# Patient Record
Sex: Female | Born: 1935 | Race: White | Hispanic: No | Marital: Married | Smoking: Former smoker
Health system: Southern US, Community
[De-identification: ages and names within clinical notes are randomized; demographics above are authoritative.]

## PROBLEM LIST (undated history)

## (undated) DIAGNOSIS — N183 Chronic kidney disease, stage 3 unspecified: Secondary | ICD-10-CM

## (undated) DIAGNOSIS — I609 Nontraumatic subarachnoid hemorrhage, unspecified: Secondary | ICD-10-CM

## (undated) HISTORY — DX: Chronic kidney disease, stage 3 unspecified: N18.30

## (undated) HISTORY — DX: Chronic kidney disease, stage 3 (moderate): N18.3

## (undated) HISTORY — DX: Nontraumatic subarachnoid hemorrhage, unspecified: I60.9

## (undated) HISTORY — PX: CHOLECYSTECTOMY: SHX55

---

## 2008-08-03 ENCOUNTER — Ambulatory Visit: Payer: Self-pay | Admitting: Family Medicine

## 2008-09-14 ENCOUNTER — Ambulatory Visit: Payer: Self-pay | Admitting: Family Medicine

## 2017-09-14 ENCOUNTER — Ambulatory Visit (INDEPENDENT_AMBULATORY_CARE_PROVIDER_SITE_OTHER): Payer: Medicare HMO | Admitting: Family Medicine

## 2017-09-14 ENCOUNTER — Encounter: Payer: Self-pay | Admitting: Family Medicine

## 2017-09-14 VITALS — BP 140/68 | HR 60 | Temp 98.6°F | Resp 12 | Ht 62.0 in | Wt 128.0 lb

## 2017-09-14 DIAGNOSIS — I10 Essential (primary) hypertension: Secondary | ICD-10-CM

## 2017-09-14 DIAGNOSIS — R413 Other amnesia: Secondary | ICD-10-CM

## 2017-09-14 DIAGNOSIS — Z23 Encounter for immunization: Secondary | ICD-10-CM

## 2017-09-14 DIAGNOSIS — E7800 Pure hypercholesterolemia, unspecified: Secondary | ICD-10-CM | POA: Diagnosis not present

## 2017-09-14 DIAGNOSIS — Z7689 Persons encountering health services in other specified circumstances: Secondary | ICD-10-CM | POA: Diagnosis not present

## 2017-09-14 NOTE — Progress Notes (Signed)
Subjective:    Patient ID: Susan Bradley, female    DOB: 1932/01/30, 83 y.o.   MRN: [id number]  HPI  She is a very pleasant 83 year old white female who is here today with her son-in-law to establish care.  Her son-in-law is a friend of mine and a Radio broadcast assistantlocal doctor..  [facility] [place] [facility] [place].  Her husband recently underwent back surgery and required a short-term stay in a rehab facility.  This apparently exacerbated underlying dementia that had previously been somewhat masked and the family decided that it was no longer prudent to have them live 2 hours away and they recommended that they move into a home closer to their daughter and son-in-law.  Patient is apparently suffering from sundowning.  Sundowning occurs typically around 2 or 3 in the afternoon and gradually worsens throughout the evening.  She demonstrates restless behavior and she paces the floor.  She quickly becomes agitated.  She has wild mood swings.  Her son-in-law had tried her on Seroquel.  Dose was started at 25 mg and up titrated to 150 mg.  Initially they saw benefit with sedation.  However after the patient adjusted to the medication, it seemed to actually exacerbate her sundowning and they have discontinued the Seroquel.  She is currently on maximum dose Zoloft 200 mg a day.  She has been on this medication for quite some time.  She is recently begun gabapentin off label to treat sundowning.  She started at 100 mg twice a day and has now up titrated to 200 mg twice a day.  Family is seeing some benefit from this with no apparent side effects at the present time.  Past medical history is unremarkable aside from 20 years ago when she suffered a subarachnoid hemorrhage after a fall and a head injury.  There was no apparent personality changes at that time.  Memory loss however became more obvious after the patient turned 67.  He has been gradually progressive and recently became  severe.  Today on our encounter however the patient is bright and alert.  She smiles and answers questions appropriately although some of her answers are incorrect.  She follows commands.  She is due today for a shot.  She has had Pneumovax 23 in the past Past Medical History:  Diagnosis Date  . SAH (subarachnoid hemorrhage) (HCC)    2000   Past Surgical History:  Procedure Laterality Date  . CHOLECYSTECTOMY     No current outpatient prescriptions on file prior to visit.   No current facility-administered medications on file prior to visit.    No Known Allergies Social History   Social History  . Marital status: Married    Spouse name: N/A  . Number of children: N/A  . Years of education: N/A   Occupational History  . Not on file.   Social History Main Topics  . Smoking status: Current Some Day Smoker  . Smokeless tobacco: Never Used  . Alcohol use No  . Drug use: No  . Sexual activity: Not on file   Other Topics Concern  . Not on file   Social History Narrative  . No narrative on file   No family history on file.   Review of Systems  All other systems reviewed and are negative.      Objective:   Physical Exam  Constitutional: She is oriented to person, place, and time. She appears well-developed and well-nourished. No distress.  HENT:  Right Ear: External ear normal.  Left Ear: External ear normal.  Nose: Nose normal.  Mouth/Throat: Oropharynx is clear and moist. No oropharyngeal exudate.  Eyes: Pupils are equal, round, and reactive to light. Conjunctivae and EOM are normal. Right eye exhibits no discharge. Left eye exhibits no discharge. No scleral icterus.  Neck: Normal range of motion. Neck supple. No JVD present. No thyromegaly present.  Cardiovascular: Normal rate and regular rhythm.   Murmur heard. Pulmonary/Chest: Effort normal and breath sounds normal. No respiratory distress. She has no wheezes. She has no rales.  Abdominal: Soft. Bowel sounds are  normal. She exhibits no distension and no mass. There is no tenderness. There is no rebound and no guarding.  Musculoskeletal: She exhibits no edema.  Lymphadenopathy:    She has no cervical adenopathy.  Neurological: She is alert and oriented to person, place, and time. She has normal reflexes. She displays normal reflexes. No cranial nerve deficit. She exhibits normal muscle tone. Coordination normal.  Skin: Skin is warm. No rash noted. She is not diaphoretic. No erythema. No pallor.  Psychiatric: She has a normal mood and affect. Her behavior is normal. Judgment and thought content normal.  Vitals reviewed.         Assessment & Plan:  Memory loss - Plan: CBC with Differential/Platelet, COMPLETE METABOLIC PANEL WITH GFR, TSH, Vitamin B12  Needs flu shot - Plan: Flu vaccine HIGH DOSE PF (Fluzone High dose)  Establishing care with new doctor, encounter for  Benign essential HTN  Pure hypercholesterolemia  At the present time we will continue the patient on Zoloft 200 mg a day.  Continue gabapentin 200 mg p.o. twice daily and reassess in 1 week.  They are using Xanax sparingly as needed for agitation.  Consider increasing gabapentin up to a maximum of 300 3 times daily.  I will check a CBC and a CMP today as well as a TSH and a vitamin B12 B12.  Blood pressure is well controlled, I see no reason to change her blood pressure medication at this time.  She will receive a flu shot.  If she continues to see mood swings, I would recommend discontinuing Zoloft and trying Trintellix.  He is failed Aricept in the past due to GI toxicity.  She is currently on Namenda.

## 2017-09-15 LAB — COMPLETE METABOLIC PANEL WITH GFR
AG RATIO: 1.7 (calc) (ref 1.0–2.5)
ALBUMIN MSPROF: 4.2 g/dL (ref 3.6–5.1)
ALKALINE PHOSPHATASE (APISO): 104 U/L (ref 33–130)
ALT: 14 U/L (ref 6–29)
AST: 19 U/L (ref 10–35)
BILIRUBIN TOTAL: 0.6 mg/dL (ref 0.2–1.2)
BUN / CREAT RATIO: 28 (calc) — AB (ref 6–22)
BUN: 39 mg/dL — ABNORMAL HIGH (ref 7–25)
CHLORIDE: 110 mmol/L (ref 98–110)
CO2: 25 mmol/L (ref 20–32)
Calcium: 9.4 mg/dL (ref 8.6–10.4)
Creat: 1.37 mg/dL — ABNORMAL HIGH (ref 0.60–0.88)
GFR, Est African American: 41 mL/min/{1.73_m2} — ABNORMAL LOW (ref >=60)
GFR, Est Non African American: 36 mL/min/{1.73_m2} — ABNORMAL LOW (ref >=60)
GLOBULIN: 2.5 g/dL (ref 1.9–3.7)
Glucose, Bld: 98 mg/dL (ref 65–99)
POTASSIUM: 5.1 mmol/L (ref 3.5–5.3)
SODIUM: 142 mmol/L (ref 135–146)
Total Protein: 6.7 g/dL (ref 6.1–8.1)

## 2017-09-15 LAB — CBC WITH DIFFERENTIAL/PLATELET
BASOS PCT: 0.3 %
Basophils Absolute: 23 cells/uL (ref 0–200)
EOS PCT: 2.7 %
Eosinophils Absolute: 203 cells/uL (ref 15–500)
HEMATOCRIT: 34.9 % — AB (ref 35.0–45.0)
HEMOGLOBIN: 11.9 g/dL (ref 11.7–15.5)
Lymphs Abs: 1590 cells/uL (ref 850–3900)
MCH: 31.5 pg (ref 27.0–33.0)
MCHC: 34.1 g/dL (ref 32.0–36.0)
MCV: 92.3 fL (ref 80.0–100.0)
MPV: 9.9 fL (ref 7.5–12.5)
Monocytes Relative: 6.4 %
Neutro Abs: 5205 cells/uL (ref 1500–7800)
Neutrophils Relative %: 69.4 %
Platelets: 257 10*3/uL (ref 140–400)
RBC: 3.78 10*6/uL — AB (ref 3.80–5.10)
RDW: 12.2 % (ref 11.0–15.0)
Total Lymphocyte: 21.2 %
WBC: 7.5 10*3/uL (ref 3.8–10.8)
WBCMIX: 480 {cells}/uL (ref 200–950)

## 2017-09-15 LAB — VITAMIN B12: VITAMIN B 12: 384 pg/mL (ref 200–1100)

## 2017-09-15 LAB — TSH: TSH: 2.24 m[IU]/L (ref 0.40–4.50)

## 2017-09-17 ENCOUNTER — Encounter: Payer: Self-pay | Admitting: Family Medicine

## 2017-09-17 DIAGNOSIS — N183 Chronic kidney disease, stage 3 unspecified: Secondary | ICD-10-CM | POA: Insufficient documentation

## 2017-10-01 ENCOUNTER — Telehealth: Payer: Self-pay | Admitting: Family Medicine

## 2017-10-01 NOTE — Telephone Encounter (Signed)
Pt is now taking gabapentin 2 pills 3 times daily and is almost out (only has enough to last this afternoon) wants us to call in script to [facility] [address] [place]. Also wants zoloft refilled and she is taking 2 pills once daily now, instead of how it was prescribed.

## 2017-10-02 MED ORDER — SERTRALINE HCL 100 MG PO TABS: 200.0000 mg | ORAL_TABLET | Freq: Every day | ORAL | 5 refills | Status: DC

## 2017-10-02 MED ORDER — GABAPENTIN 100 MG PO CAPS: 200.0000 mg | ORAL_CAPSULE | Freq: Three times a day (TID) | ORAL | 5 refills | Status: DC

## 2017-10-02 NOTE — Telephone Encounter (Signed)
Medication called/sent to requested pharmacy  

## 2017-10-06 ENCOUNTER — Emergency Department: Payer: Medicare HMO

## 2017-10-06 ENCOUNTER — Other Ambulatory Visit: Payer: Self-pay

## 2017-10-06 ENCOUNTER — Observation Stay
Admission: EM | Admit: 2017-10-06 | Discharge: 2017-10-08 | Disposition: A | Discharge status: Home / Self Care | Payer: Medicare HMO | Attending: Internal Medicine | Admitting: Internal Medicine

## 2017-10-06 DIAGNOSIS — R42 Dizziness and giddiness: Secondary | ICD-10-CM | POA: Diagnosis not present

## 2017-10-06 DIAGNOSIS — R4182 Altered mental status, unspecified: Secondary | ICD-10-CM

## 2017-10-06 DIAGNOSIS — F329 Major depressive disorder, single episode, unspecified: Secondary | ICD-10-CM | POA: Diagnosis not present

## 2017-10-06 DIAGNOSIS — I129 Hypertensive chronic kidney disease with stage 1 through stage 4 chronic kidney disease, or unspecified chronic kidney disease: Secondary | ICD-10-CM | POA: Diagnosis not present

## 2017-10-06 DIAGNOSIS — H409 Unspecified glaucoma: Secondary | ICD-10-CM | POA: Diagnosis not present

## 2017-10-06 DIAGNOSIS — R531 Weakness: Principal | ICD-10-CM

## 2017-10-06 DIAGNOSIS — R55 Syncope and collapse: Secondary | ICD-10-CM

## 2017-10-06 DIAGNOSIS — R001 Bradycardia, unspecified: Secondary | ICD-10-CM

## 2017-10-06 DIAGNOSIS — N183 Chronic kidney disease, stage 3 (moderate): Secondary | ICD-10-CM | POA: Diagnosis not present

## 2017-10-06 DIAGNOSIS — E860 Dehydration: Secondary | ICD-10-CM | POA: Insufficient documentation

## 2017-10-06 DIAGNOSIS — F17200 Nicotine dependence, unspecified, uncomplicated: Secondary | ICD-10-CM | POA: Insufficient documentation

## 2017-10-06 DIAGNOSIS — Z79899 Other long term (current) drug therapy: Secondary | ICD-10-CM | POA: Insufficient documentation

## 2017-10-06 DIAGNOSIS — F0390 Unspecified dementia without behavioral disturbance: Secondary | ICD-10-CM | POA: Insufficient documentation

## 2017-10-06 DIAGNOSIS — R262 Difficulty in walking, not elsewhere classified: Secondary | ICD-10-CM

## 2017-10-06 LAB — BASIC METABOLIC PANEL
Anion gap: 7 (ref 5–15)
BUN: 34 mg/dL — AB (ref 6–20)
CALCIUM: 8.8 mg/dL — AB (ref 8.9–10.3)
CHLORIDE: 109 mmol/L (ref 101–111)
CO2: 24 mmol/L (ref 22–32)
CREATININE: 1.25 mg/dL — AB (ref 0.44–1.00)
GFR calc non Af Amer: 39 mL/min — ABNORMAL LOW (ref >60)
GFR, EST AFRICAN AMERICAN: 45 mL/min — AB (ref >60)
GLUCOSE: 110 mg/dL — AB (ref 65–99)
Potassium: 4.2 mmol/L (ref 3.5–5.1)
Sodium: 140 mmol/L (ref 135–145)

## 2017-10-06 LAB — URINALYSIS, COMPLETE (UACMP) WITH MICROSCOPIC
BILIRUBIN URINE: NEGATIVE
Bacteria, UA: NONE SEEN
Glucose, UA: NEGATIVE mg/dL
Hgb urine dipstick: NEGATIVE
KETONES UR: NEGATIVE mg/dL
Leukocytes, UA: NEGATIVE
Nitrite: NEGATIVE
PH: 5 (ref 5.0–8.0)
Protein, ur: NEGATIVE mg/dL
SPECIFIC GRAVITY, URINE: 1.017 (ref 1.005–1.030)
SQUAMOUS EPITHELIAL / LPF: NONE SEEN

## 2017-10-06 LAB — CBC
HCT: 32.5 % — ABNORMAL LOW (ref 35.0–47.0)
Hemoglobin: 11 g/dL — ABNORMAL LOW (ref 12.0–16.0)
MCH: 31.6 pg (ref 26.0–34.0)
MCHC: 33.7 g/dL (ref 32.0–36.0)
MCV: 93.7 fL (ref 80.0–100.0)
PLATELETS: 233 10*3/uL (ref 150–440)
RBC: 3.47 MIL/uL — ABNORMAL LOW (ref 3.80–5.20)
RDW: 13.8 % (ref 11.5–14.5)
WBC: 6.4 10*3/uL (ref 3.6–11.0)

## 2017-10-06 LAB — TROPONIN I

## 2017-10-06 MED ORDER — ACETAMINOPHEN 325 MG PO TABS
650.0000 mg | ORAL_TABLET | Freq: Four times a day (QID) | ORAL | Status: DC | PRN
  Administered 2017-10-07: 21:00:00 650 mg via ORAL
  Filled 2017-10-06: qty 2

## 2017-10-06 MED ORDER — ONDANSETRON HCL 4 MG/2ML IJ SOLN: 4.0000 mg | Freq: Four times a day (QID) | INTRAMUSCULAR | Status: DC | PRN

## 2017-10-06 MED ORDER — HALOPERIDOL LACTATE 5 MG/ML IJ SOLN
1.0000 mg | Freq: Four times a day (QID) | INTRAMUSCULAR | Status: DC | PRN
  Administered 2017-10-06 – 2017-10-08 (×2): 1 mg via INTRAVENOUS
  Filled 2017-10-06 (×3): qty 1

## 2017-10-06 MED ORDER — ACETAMINOPHEN 650 MG RE SUPP: 650.0000 mg | Freq: Four times a day (QID) | RECTAL | Status: DC | PRN

## 2017-10-06 MED ORDER — CLOPIDOGREL BISULFATE 75 MG PO TABS
75.0000 mg | ORAL_TABLET | Freq: Every day | ORAL | Status: DC
  Administered 2017-10-07: 10:00:00 75 mg via ORAL
  Filled 2017-10-06: qty 1

## 2017-10-06 MED ORDER — ONDANSETRON HCL 4 MG PO TABS: 4.0000 mg | ORAL_TABLET | Freq: Four times a day (QID) | ORAL | Status: DC | PRN

## 2017-10-06 MED ORDER — QUETIAPINE FUMARATE 25 MG PO TABS
25.0000 mg | ORAL_TABLET | Freq: Every day | ORAL | Status: DC
  Administered 2017-10-06: 25 mg via ORAL
  Filled 2017-10-06: qty 1

## 2017-10-06 MED ORDER — HEPARIN SODIUM (PORCINE) 5000 UNIT/ML IJ SOLN
5000.0000 [IU] | Freq: Three times a day (TID) | INTRAMUSCULAR | Status: DC
  Administered 2017-10-07 – 2017-10-08 (×3): 5000 [IU] via SUBCUTANEOUS
  Filled 2017-10-06 (×3): qty 1

## 2017-10-06 MED ORDER — GABAPENTIN 100 MG PO CAPS
200.0000 mg | ORAL_CAPSULE | Freq: Three times a day (TID) | ORAL | Status: DC
  Administered 2017-10-07 – 2017-10-08 (×5): 200 mg via ORAL
  Filled 2017-10-06 (×5): qty 2

## 2017-10-06 MED ORDER — ALPRAZOLAM 0.5 MG PO TABS
0.5000 mg | ORAL_TABLET | Freq: Two times a day (BID) | ORAL | Status: DC | PRN
  Administered 2017-10-07: 10:00:00 0.5 mg via ORAL
  Filled 2017-10-06: qty 1

## 2017-10-06 MED ORDER — FAMOTIDINE 20 MG PO TABS
20.0000 mg | ORAL_TABLET | Freq: Every day | ORAL | Status: DC
  Administered 2017-10-07: 20 mg via ORAL
  Filled 2017-10-06: qty 1

## 2017-10-06 MED ORDER — LISINOPRIL 10 MG PO TABS
20.0000 mg | ORAL_TABLET | Freq: Every day | ORAL | Status: DC
  Administered 2017-10-07: 20 mg via ORAL
  Filled 2017-10-06: qty 2

## 2017-10-06 MED ORDER — TIMOLOL MALEATE 0.5 % OP SOLN
1.0000 [drp] | Freq: Two times a day (BID) | OPHTHALMIC | Status: DC
  Administered 2017-10-07 – 2017-10-08 (×3): 1 [drp] via OPHTHALMIC
  Filled 2017-10-06: qty 5

## 2017-10-06 MED ORDER — DOCUSATE SODIUM 100 MG PO CAPS
100.0000 mg | ORAL_CAPSULE | Freq: Two times a day (BID) | ORAL | Status: DC
  Administered 2017-10-07 – 2017-10-08 (×3): 100 mg via ORAL
  Filled 2017-10-06 (×3): qty 1

## 2017-10-06 MED ORDER — MEMANTINE HCL 5 MG PO TABS
10.0000 mg | ORAL_TABLET | Freq: Two times a day (BID) | ORAL | Status: DC
  Administered 2017-10-07 – 2017-10-08 (×3): 10 mg via ORAL
  Filled 2017-10-06 (×3): qty 2

## 2017-10-06 MED ORDER — PANTOPRAZOLE SODIUM 40 MG IV SOLR
40.0000 mg | Freq: Two times a day (BID) | INTRAVENOUS | Status: DC
  Administered 2017-10-07 (×2): 40 mg via INTRAVENOUS
  Filled 2017-10-06 (×3): qty 40

## 2017-10-06 MED ORDER — SODIUM CHLORIDE 0.9 % IV SOLN
INTRAVENOUS | Status: DC
  Administered 2017-10-06 – 2017-10-08 (×3): via INTRAVENOUS

## 2017-10-06 MED ORDER — SODIUM CHLORIDE 0.9 % IV BOLUS (SEPSIS)
1000.0000 mL | Freq: Once | INTRAVENOUS | Status: AC
  Administered 2017-10-06: 1000 mL via INTRAVENOUS

## 2017-10-06 MED ORDER — SERTRALINE HCL 50 MG PO TABS
150.0000 mg | ORAL_TABLET | Freq: Every day | ORAL | Status: DC
  Administered 2017-10-07 – 2017-10-08 (×2): 150 mg via ORAL
  Filled 2017-10-06 (×2): qty 3

## 2017-10-06 MED ORDER — DORZOLAMIDE HCL 2 % OP SOLN
1.0000 [drp] | Freq: Two times a day (BID) | OPHTHALMIC | Status: DC
  Administered 2017-10-07 – 2017-10-08 (×3): 1 [drp] via OPHTHALMIC
  Filled 2017-10-06: qty 10

## 2017-10-06 MED ORDER — BISACODYL 10 MG RE SUPP: 10.0000 mg | Freq: Every day | RECTAL | Status: DC | PRN

## 2017-10-06 NOTE — ED Notes (Signed)
Family at bedside, reports pt has dementia. Pt normally only oriented to person and place. What concerned family is that pt is more lethargic than normal and lost her balance earlier.

## 2017-10-06 NOTE — ED Provider Notes (Addendum)
[facility] Emergency Department Provider Note  Time seen: 2:20 PM  I have reviewed the triage vital signs and the nursing notes.   HISTORY  Chief Complaint Altered Mental Status    HPI Susan Bradley is a 83 y.o. female with a past medical history of CKD, SAH, dementia, presents to the emergency department with increased somnolence/altered mental status.  According to the daughter who is a caregiver along with her siblings the patient was acting fairly normal first thing this morning at breakfast.  However by around 9 or 10:00 she states the patient was acting very somnolent and tired.  Patient attempted to stand up and seemed off balance and had to sit back down.  Daughter states she had to physically assist the patient to have her stand up and help her walk which she states is abnormal.  She denies any focal weakness that she could identify.  Denies slurred speech denies vomiting, diarrhea, cough or congestion.  Denies any known fever. Past Medical History:  Diagnosis Date  . CKD (chronic kidney disease) stage 3, GFR 30-59 ml/min (HCC)   . SAH (subarachnoid hemorrhage) (HCC)    2000    Patient Active Problem List   Diagnosis Date Noted  . CKD (chronic kidney disease) stage 3, GFR 30-59 ml/min (HCC)     Past Surgical History:  Procedure Laterality Date  . CHOLECYSTECTOMY      Prior to Admission medications   Medication Sig Start Date End Date Taking? Authorizing Provider  atenolol (TENORMIN) 100 MG tablet Take 100 mg by mouth daily.     [provider]  atorvastatin (LIPITOR) 20 MG tablet Take 20 mg by mouth daily.     [provider]  gabapentin (NEURONTIN) 100 MG capsule Take 2 capsules (200 mg total) 3 (three) times daily by mouth. 10/02/17   Donita BrooksPickard, Warren T, MD  hydrochlorothiazide (MICROZIDE) 12.5 MG capsule Take 12.5 mg by mouth daily.  07/26/17 07/26/18  [provider]  lisinopril (PRINIVIL,ZESTRIL) 40 MG tablet Take 40 mg  by mouth daily.     [provider]  memantine (NAMENDA) 10 MG tablet Take 10 mg by mouth 2 (two) times daily.  01/23/17 01/23/18  [provider]  sertraline (ZOLOFT) 100 MG tablet Take 2 tablets (200 mg total) daily by mouth. 10/02/17   Donita BrooksPickard, Warren T, MD    No Known Allergies  No family history on file.  Social History Social History   Tobacco Use  . Smoking status: Current Some Day Smoker  . Smokeless tobacco: Never Used  Substance Use Topics  . Alcohol use: No  . Drug use: No    Review of Systems Unable to obtain an adequate shows accurate review of systems due to baseline dementia, possible altered mental status.  ____________________________________________   PHYSICAL EXAM:  VITAL SIGNS: ED Triage Vitals  Enc Vitals Group     BP 10/06/17 1309 (!) 97/55     Pulse Rate 10/06/17 1309 (!) 48     Resp 10/06/17 1309 20     Temp 10/06/17 1312 97.8 F (36.6 C)     Temp Source 10/06/17 1312 Oral     SpO2 10/06/17 1309 97 %     Weight 10/06/17 1308 138 lb 4.8 oz (62.7 kg)     Height --      Head Circumference --      Peak Flow --      Pain Score --      Pain Loc --  Pain Edu? --      Excl. in GC? --    Constitutional: Patient is somewhat somnolent, keeps her eyes closed through most of the exam.  She will open her eyes to verbal stimuli, answer questions but then closes her eyes once again.  Oriented to person and place only, but significant dementia per daughter. Eyes: Normal exam ENT   Head: Normocephalic and atraumatic   Mouth/Throat: Mucous membranes are moist. Cardiovascular: Normal rate, regular rhythm. Respiratory: Normal respiratory effort without tachypnea nor retractions. Breath sounds are clear  Gastrointestinal: Soft and nontender. No distention.   Musculoskeletal: Nontender with normal range of motion in all extremities.  Neurologic:  Normal speech and language. No gross focal neurologic deficits.  Equal grip strength.   Cranial nerves appear intact. Skin:  Skin is warm, dry and intact.  Psychiatric: Mood and affect are normal.   ____________________________________________    EKG  EKG reviewed and interpreted by myself shows sinus bradycardia at 48 bpm, narrow QRS, normal axis, largely normal intervals with no concerning ST changes.  ____________________________________________    RADIOLOGY  Chest x-ray is negative for acute disease.  ____________________________________________   INITIAL IMPRESSION / ASSESSMENT AND PLAN / ED COURSE  Pertinent labs & imaging results that were available during my care of the patient were reviewed by me and considered in my medical decision making (see chart for details).  Patient presents to the emergency department with increased somnolence and weakness compared to baseline per daughter.  Differential is quite broad but would include infectious etiology, dehydration, renal insufficiency, CVA/bleed, pneumonia, urinary tract infection, electrolyte abnormality.  We will check labs, chest x-ray, CT scan of the head, urinalysis and continue to closely monitor in the emergency department.  Patient and daughter agreeable to this plan of care.  Patient's labs are largely unchanged from baseline.  No significant abnormalities.  Urinalysis negative.  CT scan pending.  CT scan is negative.  Patient receiving IV fluids in the emergency department.  Blood pressure 103/46.  No old records available for comparison.  Family is asking about home health or any further in-home care.  I have placed a social work consult and discussed the patient with social work will be down to see the patient after she finishes on the floor.  Family says the patient is acting much more normal but the daughter says still is somewhat more somnolent than normal but much improved from earlier today.  We are awaiting the arrival of the other daughter (the primary caregiver) for further history however I  anticipate likely discharge home.  Patient CARE signed out to oncoming physician.  Primary caregiver/daughter is now here he states the patient cannot be discharged home.  States she normally is the caregiver for her husband however today she has been too weak to stand on her own.  Patient is a full assist to stand due to generalized weakness in the emergency department.  As the primary caregiver at her house does not believe she is safe for discharge home as the patient cannot ambulate or stand on her own and has no home health or assistance and as this appears to be an acute change from her baseline we will admit to the hospital for further workup.  I will order an MRI.  I discussed the patient with the hospitalist for admission.  ____________________________________________   FINAL CLINICAL IMPRESSION(S) / ED DIAGNOSES  Altered mental status    Minna AntisPaduchowski, Loletta Harper, MD 10/06/17 1547    Minna AntisPaduchowski, Sheddrick Lattanzio, MD  10/06/17 1614  

## 2017-10-06 NOTE — H&P (Signed)
History and Physical    Susan CivatteJoyce Bradley ZOX:[id number] DOB: Aug 13, 1932 DOA: 10/06/2017  Referring physician: Dr. Lenard LancePaduchowski PCP: Donita BrooksPickard, Warren T, MD  Specialists: none  Chief Complaint: weakness  HPI: Susan CivatteJoyce Stauder is a 83 y.o. female has a past medical history significant for [facility], CKD, and dementia now with worsening confusion and weakness with inability to ambulate. In ER, pt noted to be mildly dehydrated and bradycardia. Remainder of ER w/u negative including head CT and UA. Family is unable to care for pt at home and she is now admitted. No fever. No N/V/D. Denies CP or SOB. Family states she "nearly passed out" but no actual syncope  Review of Systems: The patient denies anorexia, fever, weight loss,, vision loss, decreased hearing, hoarseness, chest pain, syncope, dyspnea on exertion, peripheral edema, balance deficits, hemoptysis, abdominal pain, melena, hematochezia, severe indigestion/heartburn, hematuria, incontinence, genital sores, muscle weakness, suspicious skin lesions, transient blindness,  depression, unusual weight change, abnormal bleeding, enlarged lymph nodes, angioedema, and breast masses.   Past Medical History:  Diagnosis Date  . CKD (chronic kidney disease) stage 3, GFR 30-59 ml/min (HCC)   . SAH (subarachnoid hemorrhage) (HCC)    2000   Past Surgical History:  Procedure Laterality Date  . CHOLECYSTECTOMY     Social History:  reports that she has been smoking.  she has never used smokeless tobacco. She reports that she does not drink alcohol or use drugs.  No Known Allergies  History reviewed. No pertinent family history.  Prior to Admission medications   Medication Sig Start Date End Date Taking? Authorizing Provider  ALPRAZolam Prudy Feeler(XANAX) 0.5 MG tablet Take 0.5 mg by mouth 2 (two) times daily as needed for sleep or anxiety. 10/03/17  Yes [provider]  atenolol (TENORMIN) 25 MG tablet Take 25 mg by mouth daily.    Yes [provider]   dorzolamide (TRUSOPT) 2 % ophthalmic solution Place 1 drop into both eyes 2 (two) times daily.   Yes [provider]  gabapentin (NEURONTIN) 100 MG capsule Take 2 capsules (200 mg total) 3 (three) times daily by mouth. 10/02/17  Yes Pickard, Priscille HeidelbergWarren T, MD  hydrochlorothiazide (MICROZIDE) 12.5 MG capsule Take 12.5 mg by mouth daily.  07/26/17 07/26/18 Yes [provider]  lisinopril (PRINIVIL,ZESTRIL) 20 MG tablet Take 20 mg by mouth daily.    Yes [provider]  memantine (NAMENDA) 10 MG tablet Take 10 mg by mouth 2 (two) times daily.  01/23/17 01/23/18 Yes [provider]  potassium chloride (K-DUR,KLOR-CON) 10 MEQ tablet Take 10 mEq by mouth daily.   Yes [provider]  ranitidine (ZANTAC) 300 MG tablet Take 300 mg by mouth daily.   Yes [provider]  sertraline (ZOLOFT) 100 MG tablet Take 2 tablets (200 mg total) daily by mouth. 10/02/17  Yes Pickard, Priscille HeidelbergWarren T, MD  timolol (BETIMOL) 0.5 % ophthalmic solution Place 1 drop into both eyes 2 (two) times daily.   Yes [provider]   Physical Exam: Vitals:   10/06/17 1330 10/06/17 1342 10/06/17 1430 10/06/17 1500  BP: (!) 89/55 (!) 103/46 (!) 110/53 (!) 124/58  Pulse:  (!) 49 (!) 47 (!) 49  Resp: 17 14 15 14   Temp:      TempSrc:      SpO2:  97% 97% 99%  Weight:         General:  No apparent distress, WDWN, [place]/AT  Eyes: PERRL, EOMI, no scleral icterus, conjunctiva clear  ENT: moist oropharynx without exudate, TM's  benign, dentition fair  Neck: supple, no lymphadenopathy. No bruits or thyromegaly  Cardiovascular: slow rate with regular rhythm with a 2/6 systolic murmur noted. No rubs or gallops; 2+ peripheral pulses, no JVD, no peripheral edema  Respiratory: CTA biL, good air movement without wheezing, rhonchi or crackled. Respiratory effort normal  Abdomen: soft, non tender to palpation, positive bowel sounds, no guarding, no rebound  Skin: no rashes or  lesions  Musculoskeletal: normal bulk and tone, no joint swelling  Psychiatric: normal mood and affect, A&OX2(not to time)  Neurologic: CN 2-12 grossly intact, Motor strength 5/5 in all 4 groups with symmetric DTR's and non-focal sensory exam  Labs on Admission:  Basic Metabolic Panel: Recent Labs  Lab 10/06/17 1307  NA 140  K 4.2  CL 109  CO2 24  GLUCOSE 110*  BUN 34*  CREATININE 1.25*  CALCIUM 8.8*   Liver Function Tests: No results for input(s): AST, ALT, ALKPHOS, BILITOT, PROT, ALBUMIN in the last 168 hours. No results for input(s): LIPASE, AMYLASE in the last 168 hours. No results for input(s): AMMONIA in the last 168 hours. CBC: Recent Labs  Lab 10/06/17 1307  WBC 6.4  HGB 11.0*  HCT 32.5*  MCV 93.7  PLT 233   Cardiac Enzymes: Recent Labs  Lab 10/06/17 1307  TROPONINI <0.03    BNP (last 3 results) No results for input(s): BNP in the last 8760 hours.  ProBNP (last 3 results) No results for input(s): PROBNP in the last 8760 hours.  CBG: No results for input(s): GLUCAP in the last 168 hours.  Radiological Exams on Admission: Dg Chest 2 View  Result Date: 10/06/2017 CLINICAL DATA:  Altered mental status, lethargy, and loss of balance today. EXAM: CHEST  2 VIEW COMPARISON:  None. FINDINGS: Cardiomediastinal silhouette is within normal limits for AP technique. Aortic atherosclerosis is noted. The lungs are hyperinflated with slight chronic appearing interstitial coarsening and minimal scattered lung scarring. No confluent airspace opacity, edema, pleural effusion, or pneumothorax is identified. No acute osseous abnormality is identified. IMPRESSION: Hyperinflation/COPD without evidence acute cardiopulmonary disease. Electronically Signed   By: Sebastian Ache M.D.   On: 10/06/2017 14:14   Ct Head Wo Contrast  Result Date: 10/06/2017 CLINICAL DATA:  Altered mental status. EXAM: CT HEAD WITHOUT CONTRAST TECHNIQUE: Contiguous axial images were obtained from the  base of the skull through the vertex without intravenous contrast. COMPARISON:  Brain MRI 08/03/2008 FINDINGS: Brain: There is mild right greater than left lateral ventriculomegaly which is increased from 2009. Ventricular enlargement is out of proportion to the degree of sulcal enlargement. Patchy subcortical and periventricular white matter hypodensities are greater than the extent of FLAIR/ T2 abnormality on the prior MRI and are moderately advanced for age. There is no evidence of acute cortically based infarct, intracranial hemorrhage, mass, midline shift, or extra-axial fluid collection. Vascular: Calcified atherosclerosis at the skullbase. Skull: No fracture focal osseous lesion. Sinuses/Orbits: Visualized paranasal sinuses and mastoid air cells are clear. Bilateral cataract extraction. Other: None. IMPRESSION: 1. No evidence of acute infarct or hemorrhage. 2. Progressive cerebral white matter disease likely reflecting chronic small vessel ischemia. 3. Progressive mild lateral ventriculomegaly, favored to reflect central predominant cerebral atrophy or less likely hydrocephalus. Electronically Signed   By: Sebastian Ache M.D.   On: 10/06/2017 14:21    EKG: Independently reviewed.  Assessment/Plan Principal Problem:   Generalized weakness Active Problems:   Near syncope   Bradycardia   Ambulatory dysfunction   Will observe on floor with telemetry and  begin IV fluids. Hold Atenolol. MRI of brain ordered. Consult Neurology, PT, and CSW. Echo ordered due to murmur and bradycardia. Will consult Cardiology as well. Repeat labs in AM.  Diet: soft Fluids: NS@75  DVT Prophylaxis: SQ Heparin  Code Status: FULl  Family Communication: yes  Disposition Plan: TBD  Time spent: 50 min

## 2017-10-06 NOTE — [place] FL2 (Deleted)
[facility] [place] MEDICAID FL2 LEVEL OF CARE SCREENING TOOL     IDENTIFICATION  Patient Name: Susan Bradley Birthdate: 1932-09-22 Sex: female Admission Date (Current Location): 10/06/2017  [place]   Facility and Address:  [facility], [address], [place], [place] [address]      Provider Number: [id number]  Attending Physician Name and Address:  Marguarite ArbourSparks, Jeffrey D, MD  Relative Name and Phone Number:       Current Level of Care: Hospital Recommended Level of Care: Assisted Living Facility Prior Approval Number:    Date Approved/Denied:   PASRR Number:    Discharge Plan: Domiciliary (Rest home)    Current Diagnoses: Patient Active Problem List   Diagnosis Date Noted  . Near syncope 10/06/2017  . Generalized weakness 10/06/2017  . Bradycardia 10/06/2017  . Ambulatory dysfunction 10/06/2017  . CKD (chronic kidney disease) stage 3, GFR 30-59 ml/min (HCC)     Orientation RESPIRATION BLADDER Height & Weight     Self  Normal Continent Weight: 138 lb 4.8 oz (62.7 kg) Height:     BEHAVIORAL SYMPTOMS/MOOD NEUROLOGICAL BOWEL NUTRITION STATUS      Continent Diet(normal)  AMBULATORY STATUS COMMUNICATION OF NEEDS Skin   Independent Verbally Normal                       Personal Care Assistance Level of Assistance  Bathing, Feeding, Dressing, Total care Bathing Assistance: Independent Feeding assistance: Independent Dressing Assistance: Limited assistance Total Care Assistance: Limited assistance   Functional Limitations Info  Sight, Hearing, Speech Sight Info: Adequate Hearing Info: Adequate Speech Info: Adequate    SPECIAL CARE FACTORS FREQUENCY  PT (By licensed PT), OT (By licensed OT)                    Contractures Contractures Info: Not present    Additional Factors Info  Code Status, Allergies Code Status Info: full code Allergies Info: none           Current Medications (10/06/2017):   This is the current hospital active medication list Current Facility-Administered Medications  Medication Dose Route Frequency Provider Last Rate Last Dose  . 0.9 %  sodium chloride infusion   Intravenous Continuous Marguarite ArbourSparks, Jeffrey D, MD      . acetaminophen (TYLENOL) tablet 650 mg  650 mg Oral Q6H PRN Marguarite ArbourSparks, Jeffrey D, MD       Or  . acetaminophen (TYLENOL) suppository 650 mg  650 mg Rectal Q6H PRN Marguarite ArbourSparks, Jeffrey D, MD      . ALPRAZolam Prudy Feeler(XANAX) tablet 0.5 mg  0.5 mg Oral BID PRN Marguarite ArbourSparks, Jeffrey D, MD      . bisacodyl (DULCOLAX) suppository 10 mg  10 mg Rectal Daily PRN Marguarite ArbourSparks, Jeffrey D, MD      . clopidogrel (PLAVIX) tablet 75 mg  75 mg Oral Daily Marguarite ArbourSparks, Jeffrey D, MD      . docusate sodium (COLACE) capsule 100 mg  100 mg Oral BID Marguarite ArbourSparks, Jeffrey D, MD      . dorzolamide (TRUSOPT) 2 % ophthalmic solution 1 drop  1 drop Both Eyes BID Marguarite ArbourSparks, Jeffrey D, MD      . famotidine (PEPCID) tablet 20 mg  20 mg Oral QHS Marguarite ArbourSparks, Jeffrey D, MD      . gabapentin (NEURONTIN) capsule 200 mg  200 mg Oral TID Marguarite ArbourSparks, Jeffrey D, MD      . heparin injection 5,000 Units  5,000 Units Subcutaneous Q8H Sparks,  Duane LopeJeffrey D, MD      . lisinopril (PRINIVIL,ZESTRIL) tablet 20 mg  20 mg Oral Daily Marguarite ArbourSparks, Jeffrey D, MD      . memantine [facility](NAMENDA) tablet 10 mg  10 mg Oral BID Marguarite ArbourSparks, Jeffrey D, MD      . ondansetron [facility](ZOFRAN) tablet 4 mg  4 mg Oral Q6H PRN Marguarite ArbourSparks, Jeffrey D, MD       Or  . ondansetron (ZOFRAN) injection 4 mg  4 mg Intravenous Q6H PRN Marguarite ArbourSparks, Jeffrey D, MD      . pantoprazole (PROTONIX) injection 40 mg  40 mg Intravenous Q12H Marguarite ArbourSparks, Jeffrey D, MD      . sertraline (ZOLOFT) tablet 150 mg  150 mg Oral Daily Marguarite ArbourSparks, Jeffrey D, MD      . timolol (BETIMOL) 0.5 % ophthalmic solution 1 drop  1 drop Both Eyes BID Marguarite ArbourSparks, Jeffrey D, MD       Current Outpatient Medications  Medication Sig Dispense Refill  . ALPRAZolam (XANAX) 0.5 MG tablet Take 0.5 mg by mouth 2 (two) times daily as needed for sleep or anxiety.     Marland Kitchen. atenolol (TENORMIN) 25 MG tablet Take 25 mg by mouth daily.     . dorzolamide (TRUSOPT) 2 % ophthalmic solution Place 1 drop into both eyes 2 (two) times daily.    Marland Kitchen. gabapentin (NEURONTIN) 100 MG capsule Take 2 capsules (200 mg total) 3 (three) times daily by mouth. 180 capsule 5  . hydrochlorothiazide (MICROZIDE) 12.5 MG capsule Take 12.5 mg by mouth daily.     Marland Kitchen. lisinopril (PRINIVIL,ZESTRIL) 20 MG tablet Take 20 mg by mouth daily.     . memantine (NAMENDA) 10 MG tablet Take 10 mg by mouth 2 (two) times daily.     . potassium chloride (K-DUR,KLOR-CON) 10 MEQ tablet Take 10 mEq by mouth daily.    . ranitidine (ZANTAC) 300 MG tablet Take 300 mg by mouth daily.    . sertraline (ZOLOFT) 100 MG tablet Take 2 tablets (200 mg total) daily by mouth. 60 tablet 5  . timolol (BETIMOL) 0.5 % ophthalmic solution Place 1 drop into both eyes 2 (two) times daily.       Discharge Medications: Please see discharge summary for a list of discharge medications.  Relevant Imaging Results:  Relevant Lab Results:   Additional Information [id number]  Arrie SenateBandi, Hanad Leino HostetterM, [place]

## 2017-10-06 NOTE — Clinical Social Work Note (Signed)
Clinical Social Work Assessment  Patient Details  Name: Susan Bradley MRN: [id number] Date of Birth: Dec 28, 1931  Date of referral:  10/06/17               Reason for consult:  Facility Placement, Discharge Planning                Permission sought to share information with:  Family Supports Permission granted to share information::  Yes, Verbal Permission Granted  Name::     Susan Bradley ( daughter) Susan Bradley [phone] Susan Bradley son  Agency::  All facilities  Relationship::     Contact Information:     Housing/Transportation Living arrangements for the past 2 months:  Apartment Source of Information:  Power of [place], Adult Children Patient Interpreter Needed:  None Criminal Activity/Legal Involvement Pertinent to Current Situation/Hospitalization:  No - Comment as needed Significant Relationships:  Adult Children, Church, [place] [employer], Other Family Members, Spouse Lives with:  Adult Children, Spouse Do you feel safe going back to the place where you live?  No(Daughter is very concerned with altered mental status) Need for family participation in patient care:  Yes (Comment)  Care giving concerns: Daughter Susan Bradley very concerned and unable to care for both her aged parents and with her mother having altered mental status family feels overwhelmed to to have patient return to her house.   Social Worker assessment / plan: LCSW introduced myself to patient 83 year old female and sought verbal permission to speak in front of her family members. Patient agreed, daughters are health care power of attorneys and explained patient has dementia. Patient is very independent with her ADLs and does not use a walker. She has a living spouse of [phone] years who resides with patient in small house, daughter lives with them as well. Family would like information sent via the HUB for ALF options. Medicaid application is in progress/[employer] Medicare. Family understands patient will be in  observation-and have the financial means to have her placed in a memory care setting ( I month) until medicaid approved. Patient is oriented x1. LCSW completed assessment and fl2 started and sent out information via the HUB and will present bed offers.  Employment status:   Retired  Health and safety inspectornsurance information: [employer]/Medicare- Medicaid Pending   PT Recommendations:   TBD Information / Referral to community resources: Dementia Services    Patient/Family's Response to care: They are insistent something's is very wrong as the patient has substantial altered mental status from last night and would like answers and are concerned medically  Patient/Family's Understanding of and Emotional Response to Diagnosis, Current Treatment, and Prognosis:  Family is not sure what the best plan will be for the patient but have agreed for LCSW to send information out via the HUB for memory care -ALF.  Emotional Assessment Appearance:  Appears stated age Attitude/Demeanor/Rapport:  Unable to Assess Affect (typically observed):  Irritable, Restless Orientation:  Oriented to Self Alcohol / Substance use:  Not Applicable Psych involvement (Current and /or in the community):  No (Comment)  Discharge Needs  Concerns to be addressed:  Cognitive Concerns, Home Safety Concerns Readmission within the last 30 days:    Current discharge risk:  None Barriers to Discharge:  Continued Medical Work up   KurtenBandi, Ivylandlaudine M, LCSW 10/06/2017, 5:10 PM

## 2017-10-06 NOTE — ED Triage Notes (Signed)
Pt came to ED via EMS from family's house. Pt was fine this morning per EMS, altered mental status starting 1 hour ago. Pt alert to name and place. Hr 48.

## 2017-10-07 ENCOUNTER — Observation Stay: Payer: Medicare HMO

## 2017-10-07 DIAGNOSIS — R531 Weakness: Secondary | ICD-10-CM | POA: Diagnosis not present

## 2017-10-07 LAB — COMPREHENSIVE METABOLIC PANEL
ALBUMIN: 3.2 g/dL — AB (ref 3.5–5.0)
ALT: 14 U/L (ref 14–54)
ANION GAP: 6 (ref 5–15)
AST: 20 U/L (ref 15–41)
Alkaline Phosphatase: 103 U/L (ref 38–126)
BUN: 25 mg/dL — AB (ref 6–20)
CALCIUM: 8.4 mg/dL — AB (ref 8.9–10.3)
CO2: 24 mmol/L (ref 22–32)
Chloride: 110 mmol/L (ref 101–111)
Creatinine, Ser: 1.04 mg/dL — ABNORMAL HIGH (ref 0.44–1.00)
GFR calc Af Amer: 56 mL/min — ABNORMAL LOW (ref >60)
GFR calc non Af Amer: 48 mL/min — ABNORMAL LOW (ref >60)
GLUCOSE: 117 mg/dL — AB (ref 65–99)
Potassium: 3.9 mmol/L (ref 3.5–5.1)
Sodium: 140 mmol/L (ref 135–145)
TOTAL PROTEIN: 5.9 g/dL — AB (ref 6.5–8.1)
Total Bilirubin: 0.6 mg/dL (ref 0.3–1.2)

## 2017-10-07 LAB — CBC
HCT: 33.4 % — ABNORMAL LOW (ref 35.0–47.0)
HEMOGLOBIN: 11.6 g/dL — AB (ref 12.0–16.0)
MCH: 32.4 pg (ref 26.0–34.0)
MCHC: 34.8 g/dL (ref 32.0–36.0)
MCV: 92.9 fL (ref 80.0–100.0)
Platelets: 232 10*3/uL (ref 150–440)
RBC: 3.59 MIL/uL — ABNORMAL LOW (ref 3.80–5.20)
RDW: 13.8 % (ref 11.5–14.5)
WBC: 5.8 10*3/uL (ref 3.6–11.0)

## 2017-10-07 MED ORDER — ALPRAZOLAM 0.5 MG PO TABS
0.2500 mg | ORAL_TABLET | Freq: Two times a day (BID) | ORAL | Status: DC | PRN
  Administered 2017-10-07 – 2017-10-08 (×2): 0.25 mg via ORAL
  Filled 2017-10-07 (×2): qty 1

## 2017-10-07 NOTE — Progress Notes (Signed)
Patient returned to the flor from MRI at this time

## 2017-10-07 NOTE — Progress Notes (Signed)
Patient has progressively gotten more confused and impulsive this evening. Have been trying to get in contact with patient's family to see if they can sit with patient tonight for safety before placing telesitter/sitter at bedside. Tried patient's daughter, Jamesetta Sohyllis, who is local/primary care giver x2 but didn't answer & mailbox is full. Patient's other daughter, Steward DroneBrenda, lives out of town & unable to come.   Patient is close to nurses station, low bed, call bell within reach, safety mats on floor, frequent re-orienting. PRN Xanax given at 1749 r/t patient being very anxious. Will continue to closely monitor.

## 2017-10-07 NOTE — Consult Note (Signed)
Reason for Consult:confusion  Referring Physician: Dr. Juliene PinaMody   CC: confusion   HPI: Susan Bradley is an 83 y.o. female with past medical history significant for [facility], CKD, and dementia now with worsening confusion and weakness with inability to ambulate.  As per family pt has been progressive dementia for the past 4-5 yrs and has worsened recently.  Pt came in with increased confusion and disorientation.  CTH no acute abnormalities.    Past Medical History:  Diagnosis Date  . CKD (chronic kidney disease) stage 3, GFR 30-59 ml/min (HCC)   . SAH (subarachnoid hemorrhage) (HCC)    2000    Past Surgical History:  Procedure Laterality Date  . CHOLECYSTECTOMY      History reviewed. No pertinent family history.  Social History:  reports that she quit smoking about 2 months ago. she has never used smokeless tobacco. She reports that she does not drink alcohol or use drugs.  Allergies  Allergen Reactions  . Seroquel [Quetiapine] Other (See Comments)    Makes pt's agitation worse.    Medications: I have reviewed the patient's current medications.  ROS: Unable to obtain due to confusion   Physical Examination: Blood pressure (!) 152/98, pulse (!) 52, temperature (!) 97.4 F (36.3 C), temperature source Oral, resp. rate 18, height 5\' 4"  (1.626 m), weight 59.9 kg (132 lb), SpO2 100 %.  Neurological Examination   Mental Status: Alert to name only  Cranial Nerves: II: Discs flat bilaterally; Visual fields grossly normal, pupils equal, round, reactive to light and accommodation III,IV, VI: ptosis not present, extra-ocular motions intact bilaterally V,VII: smile symmetric, facial light touch sensation normal bilaterally VIII: hearing normal bilaterally IX,X: gag reflex present XI: bilateral shoulder shrug XII: midline tongue extension Motor: Right : Upper extremity   4/5    Left:     Upper extremity   4/5  Lower extremity   4/5     Lower extremity   4/5 Tone and bulk:normal tone  throughout; no atrophy noted Sensory: Pinprick and light touch intact throughout, bilaterally Deep Tendon Reflexes: 1+ and symmetric throughout Plantars: Right: downgoing   Left: downgoing Cerebellar: normal finger-to-nose Gait: not tested       Laboratory Studies:   Basic Metabolic Panel: Recent Labs  Lab 10/06/17 1307  NA 140  K 4.2  CL 109  CO2 24  GLUCOSE 110*  BUN 34*  CREATININE 1.25*  CALCIUM 8.8*    Liver Function Tests: No results for input(s): AST, ALT, ALKPHOS, BILITOT, PROT, ALBUMIN in the last 168 hours. No results for input(s): LIPASE, AMYLASE in the last 168 hours. No results for input(s): AMMONIA in the last 168 hours.  CBC: Recent Labs  Lab 10/06/17 1307  WBC 6.4  HGB 11.0*  HCT 32.5*  MCV 93.7  PLT 233    Cardiac Enzymes: Recent Labs  Lab 10/06/17 1307  TROPONINI <0.03    BNP: Invalid input(s): POCBNP  CBG: No results for input(s): GLUCAP in the last 168 hours.  Microbiology: No results found for this or any previous visit.  Coagulation Studies: No results for input(s): LABPROT, INR in the last 72 hours.  Urinalysis:  Recent Labs  Lab 10/06/17 1311  COLORURINE YELLOW*  LABSPEC 1.017  PHURINE 5.0  GLUCOSEU NEGATIVE  HGBUR NEGATIVE  BILIRUBINUR NEGATIVE  KETONESUR NEGATIVE  PROTEINUR NEGATIVE  NITRITE NEGATIVE  LEUKOCYTESUR NEGATIVE    Lipid Panel:  No results found for: CHOL, TRIG, HDL, CHOLHDL, VLDL, LDLCALC  HgbA1C: No results found for: HGBA1C  Urine Drug Screen:  No results found for: LABOPIA, COCAINSCRNUR, LABBENZ, AMPHETMU, THCU, LABBARB  Alcohol Level: No results for input(s): ETH in the last 168 hours.  Other results: EKG: normal EKG, normal sinus rhythm, unchanged from previous tracings.  Imaging: Dg Chest 2 View  Result Date: 10/06/2017 CLINICAL DATA:  Altered mental status, lethargy, and loss of balance today. EXAM: CHEST  2 VIEW COMPARISON:  None. FINDINGS: Cardiomediastinal silhouette is within  normal limits for AP technique. Aortic atherosclerosis is noted. The lungs are hyperinflated with slight chronic appearing interstitial coarsening and minimal scattered lung scarring. No confluent airspace opacity, edema, pleural effusion, or pneumothorax is identified. No acute osseous abnormality is identified. IMPRESSION: Hyperinflation/COPD without evidence acute cardiopulmonary disease. Electronically Signed   By: Sebastian Ache M.D.   On: 10/06/2017 14:14   Ct Head Wo Contrast  Result Date: 10/06/2017 CLINICAL DATA:  Altered mental status. EXAM: CT HEAD WITHOUT CONTRAST TECHNIQUE: Contiguous axial images were obtained from the base of the skull through the vertex without intravenous contrast. COMPARISON:  Brain MRI 08/03/2008 FINDINGS: Brain: There is mild right greater than left lateral ventriculomegaly which is increased from 2009. Ventricular enlargement is out of proportion to the degree of sulcal enlargement. Patchy subcortical and periventricular white matter hypodensities are greater than the extent of FLAIR/ T2 abnormality on the prior MRI and are moderately advanced for age. There is no evidence of acute cortically based infarct, intracranial hemorrhage, mass, midline shift, or extra-axial fluid collection. Vascular: Calcified atherosclerosis at the skullbase. Skull: No fracture focal osseous lesion. Sinuses/Orbits: Visualized paranasal sinuses and mastoid air cells are clear. Bilateral cataract extraction. Other: None. IMPRESSION: 1. No evidence of acute infarct or hemorrhage. 2. Progressive cerebral white matter disease likely reflecting chronic small vessel ischemia. 3. Progressive mild lateral ventriculomegaly, favored to reflect central predominant cerebral atrophy or less likely hydrocephalus. Electronically Signed   By: Sebastian Ache M.D.   On: 10/06/2017 14:21   Mr Brain Wo Contrast  Result Date: 10/07/2017 CLINICAL DATA:  Increased somnolence/altered mental status. Generalized  weakness. EXAM: MRI HEAD WITHOUT CONTRAST TECHNIQUE: Multiplanar, multiecho pulse sequences of the brain and surrounding structures were obtained without intravenous contrast. COMPARISON:  10/06/2017 head CT and 08/03/2008 MRI FINDINGS: Brain: There is no evidence of acute infarct, mass, midline shift, or extra-axial fluid collection. A few scattered chronic microhemorrhages are noted in the cerebral hemispheres. There is mild asymmetric right lateral ventriculomegaly with most notable dilatation of the atrium and temporal horn and new from the prior MRI. This is favored to be reflective of progressive cerebral atrophy including asymmetric right parietal white matter volume loss rather than hydrocephalus. Patchy subcortical and deep cerebral white matter T2 hyperintensities have progressed from the prior MRI and extend in a confluent manner to the subcortical white matter of the right parietal lobe, nonspecific but compatible with chronic small vessel ischemic disease. No definite cortical encephalomalacia is identified. Cerebral atrophy is overall moderately advanced for age. There is a developmental venous anomaly in the right cerebellum. Vascular: Major intracranial vascular flow voids are preserved. Skull and upper cervical spine: No suspicious marrow lesion. Sinuses/Orbits: Bilateral cataract extraction. No significant sinus disease. Other: None. IMPRESSION: 1. No acute intracranial abnormality. 2. Moderate chronic small vessel ischemic disease and cerebral atrophy, substantially from 2009. Electronically Signed   By: Sebastian Ache M.D.   On: 10/07/2017 12:06     Assessment/Plan:  83 y.o. female with past medical history significant for SAH, CKD, and dementia now with worsening confusion and  weakness with inability to ambulate.  As per family pt has been progressive dementia for the past 4-5 yrs and has worsened recently.  Pt came in with increased confusion and disorientation.  CTH no acute abnormalities.    - MRI brain done no acute abnormalities - This is likely progresive dementia associated with encephalopathy secondary to possible dehydration - Hydration  - pt/ot - follow up with neurology as out pt.     10/07/2017, 1:22 PM

## 2017-10-07 NOTE — Consult Note (Signed)
[facility]  CARDIOLOGY CONSULT NOTE  Patient ID: Susan Bradley MRN: [id number] DOB/AGE: 03/01/1932 83 y.o.  Admit date: 10/06/2017 Referring Physician Dr. Judithann SheenSParks Primary Physician   Primary Cardiologist   Reason for Consultation bradycardia  HPI: Patient is an 83 year old female with history of hypertension, chronic kidney disease and dementia who presented to the emergency room with increasing confusion and weakness and inability to ambulate.  She was noted to be dehydrated and relatively bradycardic.  Head CT was unremarkable.  Patient is on able to care for herself at home.  Laboratories in the emergency room revealed chronic renal insufficiency with serum creatinine of 1.25.  Hemoglobin and hematocrit were 11.0 and 32.5.  Serum troponin was normal.  EKG showed sinus bradycardia at a rate of 50 bpm.  She was treated with atenolol at 100 mg daily, atorvastatin 20 mg daily as well as hydrochlorothiazide quinapril as an outpatient.  She was relatively hypotensive on presentation with a blood pressure of 97/50.  She denies chest pain.  Review of Systems  Constitutional: Positive for malaise/fatigue.  Eyes: Negative.   Cardiovascular: Negative.   Genitourinary: Negative.   Skin: Negative.   Neurological: Positive for dizziness and weakness.  Endo/Heme/Allergies: Negative.   Psychiatric/Behavioral: Positive for memory loss.    Past Medical History:  Diagnosis Date  . CKD (chronic kidney disease) stage 3, GFR 30-59 ml/min (HCC)   . SAH (subarachnoid hemorrhage) (HCC)    2000    History reviewed. No pertinent family history.  Social History   Socioeconomic History  . Marital status: Married    Spouse name: Not on file  . Number of children: Not on file  . Years of education: Not on file  . Highest education level: Not on file  Social Needs  . Financial resource strain: Not very hard  . Food insecurity - worry: Never true  . Food  insecurity - inability: Never true  . Transportation needs - medical: No  . Transportation needs - non-medical: Not on file  Occupational History  . Not on file  Tobacco Use  . Smoking status: Former Smoker    Last attempt to quit: 08/06/2017    Years since quitting: 0.1  . Smokeless tobacco: Never Used  Substance and Sexual Activity  . Alcohol use: No  . Drug use: No  . Sexual activity: No  Other Topics Concern  . Not on file  Social History Narrative  . Not on file    Past Surgical History:  Procedure Laterality Date  . CHOLECYSTECTOMY       Medications Prior to Admission  Medication Sig Dispense Refill Last Dose  . ALPRAZolam (XANAX) 0.5 MG tablet Take 0.5 mg by mouth 2 (two) times daily as needed for sleep or anxiety.   PRN at PRN  . atenolol (TENORMIN) 25 MG tablet Take 25 mg by mouth daily.    10/06/2017 at 0930  . dorzolamide (TRUSOPT) 2 % ophthalmic solution Place 1 drop into both eyes 2 (two) times daily.   10/06/2017 at 0930  . gabapentin (NEURONTIN) 100 MG capsule Take 2 capsules (200 mg total) 3 (three) times daily by mouth. 180 capsule 5 10/06/2017 at 0930  . hydrochlorothiazide (MICROZIDE) 12.5 MG capsule Take 12.5 mg by mouth daily.    10/06/2017 at 0930  . lisinopril (PRINIVIL,ZESTRIL) 20 MG tablet Take 20 mg by mouth daily.    10/06/2017 at 0930  . memantine (NAMENDA) 10 MG tablet Take 10 mg  by mouth 2 (two) times daily.    10/06/2017 at 0930  . potassium chloride (K-DUR,KLOR-CON) 10 MEQ tablet Take 10 mEq by mouth daily.   10/06/2017 at 0930  . ranitidine (ZANTAC) 300 MG tablet Take 300 mg by mouth daily.   10/06/2017 at 0930  . sertraline (ZOLOFT) 100 MG tablet Take 2 tablets (200 mg total) daily by mouth. 60 tablet 5 10/06/2017 at 0930  . timolol (BETIMOL) 0.5 % ophthalmic solution Place 1 drop into both eyes 2 (two) times daily.   10/06/2017 at 0930    Physical Exam: Blood pressure (!) 145/57, pulse (!) 49, temperature 97.9 F (36.6 C), temperature source  Oral, resp. rate 18, height 5\' 4"  (1.626 m), weight 59.9 kg (132 lb), SpO2 100 %.   Wt Readings from Last 1 Encounters:  10/07/17 59.9 kg (132 lb)     General appearance: cooperative and slowed mentation Head: Normocephalic, without obvious abnormality, atraumatic Resp: clear to auscultation bilaterally Cardio: Bradycardia GI: soft, non-tender; bowel sounds normal; no masses,  no organomegaly Extremities: extremities normal, atraumatic, no cyanosis or edema Neurologic: Grossly normal  Labs:   Lab Results  Component Value Date   WBC 6.4 10/06/2017   HGB 11.0 (L) 10/06/2017   HCT 32.5 (L) 10/06/2017   MCV 93.7 10/06/2017   PLT 233 10/06/2017    Recent Labs  Lab 10/06/17 1307  NA 140  K 4.2  CL 109  CO2 24  BUN 34*  CREATININE 1.25*  CALCIUM 8.8*  GLUCOSE 110*   Lab Results  Component Value Date   TROPONINI <0.03 10/06/2017      Radiology: No acute cardiopulmonary disease. EKG: Sinus bradycardia  ASSESSMENT AND PLAN: 83 year old female with history of hypertension, chronic kidney disease and mild to moderate dementia who was admitted admitted with worsening confusion felt to have relative dehydration.  She is on Tenormin at 100 mg daily as an outpatient.  Patient denies any complaints at present but is a somewhat difficult historian.  Her daughter is giving some of the history.  It is unclear whether the patient had syncope.  She is hemodynamically stable at present with heart rates in the upper 50s.  Will discontinue Tenormin for now and follow heart rate response as this worsens out.  Echo is pending.  Likely will be able to improve after stopping Tenormin.  Further recommendations based the results of her response to this. Signed: Dalia HeadingKenneth A Jeniel Slauson MD, [facility] 10/07/2017, 9:28 AM

## 2017-10-07 NOTE — Progress Notes (Signed)
[facility] [place] [facility]   PATIENT NAME: Susan CivatteJoyce Deason    MR#:  [id number]  DATE OF BIRTH:  [birth year]  SUBJECTIVE:   Family at bedside daughter upset that patient received Seroquel yesterday without asking her. She reports that patient does not do well and Seroquel. She would like to have patient have Xanax before MRI is planned today.  REVIEW OF SYSTEMS:    Review of Systems  Constitutional: Negative for fever, chills weight loss Positive generalized weakness HENT: Negative for ear pain, nosebleeds, congestion, facial swelling, rhinorrhea, neck pain, neck stiffness and ear discharge.   Respiratory: Negative for cough, shortness of breath, wheezing  Cardiovascular: Negative for chest pain, palpitations and leg swelling.  Gastrointestinal: Negative for heartburn, abdominal pain, vomiting, diarrhea or consitpation Genitourinary: Negative for dysuria, urgency, frequency, hematuria Musculoskeletal: Negative for back pain or joint pain Neurological: Negative for dizziness, seizures, syncope, focal weakness,  numbness and headaches.  Hematological: Does not bruise/bleed easily.  Psychiatric/Behavioral: Positive for dementia     Tolerating Diet: yes      DRUG ALLERGIES:   Allergies  Allergen Reactions  . Seroquel [Quetiapine] Other (See Comments)    Makes pt's agitation worse.    VITALS:  Blood pressure (!) 145/57, pulse (!) 49, temperature 97.9 F (36.6 C), temperature source Oral, resp. rate 18, height 5\' 4"  (1.626 m), weight 59.9 kg (132 lb), SpO2 100 %.  PHYSICAL EXAMINATION:  Constitutional: Appears well-developed and well-nourished. No distress. HENT: Normocephalic. Marland Kitchen. Oropharynx is clear and moist.  Eyes: Conjunctivae and EOM are normal. PERRLA, no scleral icterus.  Neck: Normal ROM. Neck supple. No JVD. No tracheal deviation. CVS: Sinus bradycardia, S1/S2 +, no murmurs, no gallops, no carotid bruit.  Pulmonary: Effort and breath sounds  normal, no stridor, rhonchi, wheezes, rales.  Abdominal: Soft. BS +,  no distension, tenderness, rebound or guarding.  Musculoskeletal: Normal range of motion. No edema and no tenderness.  Neuro: Alert. CN 2-12 grossly intact. No focal deficits. Skin: Skin is warm and dry. No rash noted. Psychiatric: Normal mood and affect.      LABORATORY PANEL:   CBC Recent Labs  Lab 10/06/17 1307  WBC 6.4  HGB 11.0*  HCT 32.5*  PLT 233   ------------------------------------------------------------------------------------------------------------------  Chemistries  Recent Labs  Lab 10/06/17 1307  NA 140  K 4.2  CL 109  CO2 24  GLUCOSE 110*  BUN 34*  CREATININE 1.25*  CALCIUM 8.8*   ------------------------------------------------------------------------------------------------------------------  Cardiac Enzymes Recent Labs  Lab 10/06/17 1307  TROPONINI <0.03   ------------------------------------------------------------------------------------------------------------------  RADIOLOGY:  Dg Chest 2 View  Result Date: 10/06/2017 CLINICAL DATA:  Altered mental status, lethargy, and loss of balance today. EXAM: CHEST  2 VIEW COMPARISON:  None. FINDINGS: Cardiomediastinal silhouette is within normal limits for AP technique. Aortic atherosclerosis is noted. The lungs are hyperinflated with slight chronic appearing interstitial coarsening and minimal scattered lung scarring. No confluent airspace opacity, edema, pleural effusion, or pneumothorax is identified. No acute osseous abnormality is identified. IMPRESSION: Hyperinflation/COPD without evidence acute cardiopulmonary disease. Electronically Signed   By: Sebastian AcheAllen  Grady M.D.   On: 10/06/2017 14:14   Ct Head Wo Contrast  Result Date: 10/06/2017 CLINICAL DATA:  Altered mental status. EXAM: CT HEAD WITHOUT CONTRAST TECHNIQUE: Contiguous axial images were obtained from the base of the skull through the vertex without intravenous  contrast. COMPARISON:  Brain MRI 08/03/2008 FINDINGS: Brain: There is mild right greater than left lateral ventriculomegaly which is increased from 2009. Ventricular enlargement is  out of proportion to the degree of sulcal enlargement. Patchy subcortical and periventricular white matter hypodensities are greater than the extent of FLAIR/ T2 abnormality on the prior MRI and are moderately advanced for age. There is no evidence of acute cortically based infarct, intracranial hemorrhage, mass, midline shift, or extra-axial fluid collection. Vascular: Calcified atherosclerosis at the skullbase. Skull: No fracture focal osseous lesion. Sinuses/Orbits: Visualized paranasal sinuses and mastoid air cells are clear. Bilateral cataract extraction. Other: None. IMPRESSION: 1. No evidence of acute infarct or hemorrhage. 2. Progressive cerebral white matter disease likely reflecting chronic small vessel ischemia. 3. Progressive mild lateral ventriculomegaly, favored to reflect central predominant cerebral atrophy or less likely hydrocephalus. Electronically Signed   By: Sebastian AcheAllen  Grady M.D.   On: 10/06/2017 14:21     ASSESSMENT AND PLAN:   83 year old female with a history of chronic kidney disease stage III, mild to moderate dementia and essential hypertension who presents with generalized weakness and confusion.  1. Generalized weakness: Patient will undergo MRI to evaluate for CVA. Family would like to have Xanax prior to MRI. Physical therapy consultation requested Patient was noted have sinus bradycardia and therefore beta blocker has been discontinued which could be contributing to generalized weakness. No signs of infection. Head CT was negative for acute pathology. Await neurology consultation.  2. Sinus bradycardia: Continue to hold atenolol. Case discussed with cardiology.  3. Dementia: Continue Namenda 4. Depression: Continue Zoloft 5. Essential hypertension: Due to bradycardia atenolol has been  discontinued HCTZ discontinued for now due to concerns of dehydration. Continue lisinopril 6. Glaucoma: Continue eyedrops  Management plans discussed with the patient and daughter and she is in agreement.  CODE STATUS: FULL  TOTAL TIME TAKING CARE OF THIS PATIENT: 30 minutes.     POSSIBLE D/C tomorrow, DEPENDING ON CLINICAL CONDITION.   Marquite Attwood M.D on 10/07/2017 at 10:30 AM  Between 7am to 6pm - Pager - [phone] After 6pm go to [email or web address] - password [email or web address]  [facility]  Office  [phone]  CC: Primary care physician; Donita BrooksPickard, Warren T, MD  Note: This dictation was prepared with Dragon dictation along with smaller phrase technology. Any transcriptional errors that result from this process are unintentional.

## 2017-10-07 NOTE — Progress Notes (Signed)
Assumed patient care at 1500. Patient is pleasantly confused. No family currently at bedside. Room near nurses station, bed alarm on, call bell within reach.   CSW working on placement. PT eval completed. Cardiology & Neurology following. HR sinus rhythm in 60s- Atenolol stopped today. Will monitor closely.

## 2017-10-07 NOTE — Progress Notes (Signed)
Late submission due to time constraints  LCSW unable to obtain passr # and web site for NCMUST was not up.  Delta Air LinesClaudine Xenia Nile LCSW [phone]

## 2017-10-07 NOTE — Progress Notes (Signed)
LCSW was able to obtain a PASSR number and spoke to patients daughter. She has requested information sent to [facility]: LCSW called [facility] and they do not have a bed available today. They will review Fl2 and assessment and will follow up with medical floor SW.  Weekday medical SW will present BED offers tomorrow to family   Arrie SenateClaudine Tregan Read LCSW [phone]

## 2017-10-07 NOTE — Evaluation (Signed)
Physical Therapy Evaluation Patient Details Name: Susan Bradley MRN: [id number] DOB: 08-24-1932 Today's Date: 10/07/2017   History of Present Illness  Patient is a pleasant 83 y/o female that presents after family noted she was difficult to arouse and severely weak yesterday. Workup thus far has largely been negative.   Clinical Impression  Patient is a fairly independent 83 y/o female that presents after episode of lethargy and weakness yesterday. CT and MRI of the brain have both been negative for acute processes. Today she is able to transfer in and out of bed independently, no assistance required to complete sit to stand transfers. She does require use of bilateral UEs on IV pole to maintain balance during ambulation, she is noted to have short shuffling steps while ambulating. She otherwise appears to be at her baseline of mobility, would likely benefit from HHPT to work on higher level balance and use of RW at home.     Follow Up Recommendations Home health PT    Equipment Recommendations  Rolling walker with 5" wheels    Recommendations for Other Services       Precautions / Restrictions Precautions Precautions: Fall Restrictions Weight Bearing Restrictions: No      Mobility  Bed Mobility Overal bed mobility: Independent             General bed mobility comments: No deficits identified in bed mobility.   Transfers Overall transfer level: Independent               General transfer comment: No deficits identified in sit to stand transfers.   Ambulation/Gait Ambulation/Gait assistance: Modified independent (Device/Increase time) Ambulation Distance (Feet): 200 Feet Assistive device: (IV pole with bilateral UEs) Gait Pattern/deviations: Shuffle   Gait velocity interpretation: <1.8 ft/sec, indicative of risk for recurrent falls General Gait Details: Patient noted to have short shuffling gait pattern, discussed with daughter and this appears to be her baseline,  she does not appear to favor one LE vs ther other.   Stairs            Wheelchair Mobility    Modified Rankin (Stroke Patients Only)       Balance Overall balance assessment: Modified Independent                                           Pertinent Vitals/Pain Pain Assessment: No/denies pain    Home Living Family/patient expects to be discharged to:: Private residence Living Arrangements: Spouse/significant other;Children Available Help at Discharge: Family;Available PRN/intermittently Type of Home: House       Home Layout: One level Home Equipment: Walker - 2 wheels      Prior Function Level of Independence: Independent         Comments: Prior to this admission family reports patient has been independent with mobility, her cognition is more of her limiting factor.      Hand Dominance        Extremity/Trunk Assessment   Upper Extremity Assessment Upper Extremity Assessment: Overall WFL for tasks assessed    Lower Extremity Assessment Lower Extremity Assessment: Overall WFL for tasks assessed       Communication   Communication: No difficulties  Cognition Arousal/Alertness: Awake/alert Behavior During Therapy: WFL for tasks assessed/performed Overall Cognitive Status: History of cognitive impairments - at baseline  General Comments: Patient is able to respond to questions, she does not appear to provide much insight into her current situation.       General Comments      Exercises     Assessment/Plan    PT Assessment Patient needs continued PT services  PT Problem List Decreased strength;Decreased safety awareness;Decreased mobility;Decreased activity tolerance;Decreased cognition;Decreased balance;Decreased knowledge of use of DME       PT Treatment Interventions Therapeutic activities;DME instruction;Gait training;Therapeutic exercise;Stair training;Balance  training;Patient/family education;Neuromuscular re-education    PT Goals (Current goals can be found in the Care Plan section)  Acute Rehab PT Goals Patient Stated Goal: To find out why she had her episode yesterday  PT Goal Formulation: With family Time For Goal Achievement: 10/21/17 Potential to Achieve Goals: Good    Frequency Min 2X/week   Barriers to discharge        Co-evaluation               AM-PAC PT "6 Clicks" Daily Activity  Outcome Measure Difficulty turning over in bed (including adjusting bedclothes, sheets and blankets)?: None Difficulty moving from lying on back to sitting on the side of the bed? : None Difficulty sitting down on and standing up from a chair with arms (e.g., wheelchair, bedside commode, etc,.)?: None Help needed moving to and from a bed to chair (including a wheelchair)?: None Help needed walking in hospital room?: A Little Help needed climbing 3-5 steps with a railing? : A Little 6 Click Score: 22    End of Session Equipment Utilized During Treatment: Gait belt Activity Tolerance: Patient tolerated treatment well Patient left: in bed;with bed alarm set;with call bell/phone within reach;with family/visitor present Nurse Communication: Mobility status PT Visit Diagnosis: Unsteadiness on feet (R26.81);Muscle weakness (generalized) (M62.81)    Time: [id number] PT Time Calculation (min) (ACUTE ONLY): 16 min   Charges:   PT Evaluation $PT Eval Low Complexity: 1 Low     PT G Codes:   PT G-Codes **NOT FOR INPATIENT CLASS** Functional Assessment Tool Used: AM-PAC 6 Clicks Basic Mobility Functional Limitation: Mobility: Walking and moving around Mobility: Walking and Moving Around Current Status (V4098(G8978): At least 1 percent but less than 20 percent impaired, limited or restricted Mobility: Walking and Moving Around Goal Status [phone](G8979): At least 1 percent but less than 20 percent impaired, limited or restricted    Alva GarnetPatrick McNamara PT, DPT,  CSCS    10/07/2017, 2:08 PM

## 2017-10-07 NOTE — Progress Notes (Signed)
LCSW attempted to obtain and apply for PASSR number and website is not operational at this time.   Delta Air LinesClaudine Katrese Shell LCSW [phone]

## 2017-10-07 NOTE — Progress Notes (Signed)
Spoke with patient's daughter, Jamesetta Sohyllis. She does not want a telesitter placed yet- will call back to give decision if someone can come sit with patient or if we can implement a safety sitter (person or video monitoring).

## 2017-10-07 NOTE — Progress Notes (Signed)
Patient off the floor to MRI, patient was pre medicated with xanax 0.5mg  as per order , family at bedside .

## 2017-10-07 NOTE — Progress Notes (Signed)
Claudine CSW spoke with daughter about update on placement search.

## 2017-10-07 NOTE — Progress Notes (Signed)
Patient's daughter, Jamesetta Sohyllis, called back. Someone from their family will be coming to stay with patient tonight to ensure patient safety.

## 2017-10-07 NOTE — Care Management Obs Status (Signed)
MEDICARE OBSERVATION STATUS NOTIFICATION   Patient Details  Name: Susan Bradley MRN: [id number] Date of Birth: 08-15-1932   Medicare Observation Status Notification Given:  Yes    Abagail Limb A, RN 10/07/2017, 1:52 PM

## 2017-10-07 NOTE — NC FL2 (Signed)
[place] MEDICAID FL2 LEVEL OF CARE SCREENING TOOL     IDENTIFICATION  Patient Name: Susan CivatteJoyce Bradley Birthdate: Jun 17, 1932 Sex: female Admission Date (Current Location): 10/06/2017  [place]   Facility and Address:  [facility], [address], [place], [place] [address]      Provider Number: [id number]  Attending Physician Name and Address:  Adrian SaranMody, Sital, MD  Relative Name and Phone Number:       Current Level of Care: Hospital Recommended Level of Care: Assisted Living Facility Prior Approval Number:    Date Approved/Denied:   PASRR Number: [id number]  Discharge Plan: Domiciliary (Rest home)    Current Diagnoses: Patient Active Problem List   Diagnosis Date Noted  . Near syncope 10/06/2017  . Generalized weakness 10/06/2017  . Bradycardia 10/06/2017  . Ambulatory dysfunction 10/06/2017  . CKD (chronic kidney disease) stage 3, GFR 30-59 ml/min (HCC)     Orientation RESPIRATION BLADDER Height & Weight     Self  Normal Continent Weight: 132 lb (59.9 kg) Height:  5\' 4"  (162.6 cm)  BEHAVIORAL SYMPTOMS/MOOD NEUROLOGICAL BOWEL NUTRITION STATUS      Continent Diet(normal)  AMBULATORY STATUS COMMUNICATION OF NEEDS Skin   Independent Verbally Normal                       Personal Care Assistance Level of Assistance  Bathing, Feeding, Dressing, Total care Bathing Assistance: Independent Feeding assistance: Independent Dressing Assistance: Limited assistance Total Care Assistance: Limited assistance   Functional Limitations Info  Sight, Hearing, Speech Sight Info: Adequate Hearing Info: Adequate Speech Info: Adequate    SPECIAL CARE FACTORS FREQUENCY  PT (By licensed PT), OT (By licensed OT)                    Contractures Contractures Info: Not present    Additional Factors Info  Code Status, Allergies Code Status Info: full code Allergies Info: none           Current Medications  (10/07/2017):  This is the current hospital active medication list Current Facility-Administered Medications  Medication Dose Route Frequency Provider Last Rate Last Dose  . 0.9 %  sodium chloride infusion   Intravenous Continuous Marguarite ArbourSparks, Jeffrey D, MD 75 mL/hr at 10/07/17 0902    . acetaminophen (TYLENOL) tablet 650 mg  650 mg Oral Q6H PRN Marguarite ArbourSparks, Jeffrey D, MD       Or  . acetaminophen (TYLENOL) suppository 650 mg  650 mg Rectal Q6H PRN Marguarite ArbourSparks, Jeffrey D, MD      . ALPRAZolam Prudy Feeler(XANAX) tablet 0.25 mg  0.25 mg Oral BID PRN Adrian SaranMody, Sital, MD      . bisacodyl (DULCOLAX) suppository 10 mg  10 mg Rectal Daily PRN Marguarite ArbourSparks, Jeffrey D, MD      . clopidogrel (PLAVIX) tablet 75 mg  75 mg Oral Daily Marguarite ArbourSparks, Jeffrey D, MD   75 mg at 10/07/17 1007  . docusate sodium (COLACE) capsule 100 mg  100 mg Oral BID Marguarite ArbourSparks, Jeffrey D, MD   100 mg at 10/07/17 1007  . dorzolamide (TRUSOPT) 2 % ophthalmic solution 1 drop  1 drop Both Eyes BID Marguarite ArbourSparks, Jeffrey D, MD   1 drop at 10/07/17 1026  . famotidine (PEPCID) tablet 20 mg  20 mg Oral QHS Marguarite ArbourSparks, Jeffrey D, MD      . gabapentin (NEURONTIN) capsule 200 mg  200 mg Oral TID Marguarite ArbourSparks, Jeffrey D, MD   200 mg at 10/07/17 1600  .  haloperidol lactate (HALDOL) injection 1 mg  1 mg Intravenous Q6H PRN Alford HighlandWieting, Richard, MD   1 mg at 10/06/17 2031  . heparin injection 5,000 Units  5,000 Units Subcutaneous Q8H Marguarite ArbourSparks, Jeffrey D, MD   5,000 Units at 10/07/17 1326  . lisinopril (PRINIVIL,ZESTRIL) tablet 20 mg  20 mg Oral Daily Marguarite ArbourSparks, Jeffrey D, MD   20 mg at 10/07/17 1008  . memantine (NAMENDA) tablet 10 mg  10 mg Oral BID Marguarite ArbourSparks, Jeffrey D, MD   10 mg at 10/07/17 1007  . ondansetron (ZOFRAN) tablet 4 mg  4 mg Oral Q6H PRN Marguarite ArbourSparks, Jeffrey D, MD       Or  . ondansetron (ZOFRAN) injection 4 mg  4 mg Intravenous Q6H PRN Marguarite ArbourSparks, Jeffrey D, MD      . pantoprazole (PROTONIX) injection 40 mg  40 mg Intravenous Q12H Marguarite ArbourSparks, Jeffrey D, MD   40 mg at 10/07/17 1009  . sertraline (ZOLOFT) tablet  150 mg  150 mg Oral Daily Marguarite ArbourSparks, Jeffrey D, MD   150 mg at 10/07/17 1008  . timolol (TIMOPTIC) 0.5 % ophthalmic solution 1 drop  1 drop Both Eyes BID Marguarite ArbourSparks, Jeffrey D, MD   1 drop at 10/07/17 1009     Discharge Medications: Please see discharge summary for a list of discharge medications.  Relevant Imaging Results:  Relevant Lab Results:   Additional Information SSN [id number]  Susan SenateBandi, Susan Kellenberger Barker [place], [place]

## 2017-10-08 ENCOUNTER — Observation Stay
Admit: 2017-10-08 | Discharge: 2017-10-08 | Disposition: A | Discharge status: Home / Self Care | Payer: Medicare HMO | Attending: Internal Medicine | Admitting: Internal Medicine

## 2017-10-08 LAB — BASIC METABOLIC PANEL
ANION GAP: 4 — AB (ref 5–15)
BUN: 24 mg/dL — ABNORMAL HIGH (ref 6–20)
CALCIUM: 8.6 mg/dL — AB (ref 8.9–10.3)
CHLORIDE: 110 mmol/L (ref 101–111)
CO2: 25 mmol/L (ref 22–32)
Creatinine, Ser: 0.99 mg/dL (ref 0.44–1.00)
GFR calc non Af Amer: 51 mL/min — ABNORMAL LOW (ref >60)
GFR, EST AFRICAN AMERICAN: 59 mL/min — AB (ref >60)
Glucose, Bld: 86 mg/dL (ref 65–99)
POTASSIUM: 3.6 mmol/L (ref 3.5–5.1)
Sodium: 139 mmol/L (ref 135–145)

## 2017-10-08 LAB — ECHOCARDIOGRAM COMPLETE
Height: 64 in
Weight: 2112 oz

## 2017-10-08 MED ORDER — PANTOPRAZOLE SODIUM 40 MG PO TBEC
40.0000 mg | DELAYED_RELEASE_TABLET | Freq: Two times a day (BID) | ORAL | Status: DC
  Administered 2017-10-08 (×2): 40 mg via ORAL
  Filled 2017-10-08 (×2): qty 1

## 2017-10-08 MED ORDER — LISINOPRIL 40 MG PO TABS: 40.0000 mg | ORAL_TABLET | Freq: Every day | ORAL | 0 refills | Status: DC

## 2017-10-08 MED ORDER — HYDRALAZINE HCL 20 MG/ML IJ SOLN
10.0000 mg | INTRAMUSCULAR | Status: DC | PRN
  Administered 2017-10-08: 10 mg via INTRAVENOUS
  Filled 2017-10-08: qty 1

## 2017-10-08 MED ORDER — LISINOPRIL 20 MG PO TABS: 40.0000 mg | ORAL_TABLET | Freq: Every day | ORAL | Status: DC

## 2017-10-08 MED ORDER — AMLODIPINE BESYLATE 5 MG PO TABS
5.0000 mg | ORAL_TABLET | Freq: Every day | ORAL | Status: DC
  Administered 2017-10-08: 5 mg via ORAL
  Filled 2017-10-08: qty 1

## 2017-10-08 NOTE — Discharge Summary (Signed)
[facility] [place] [facility]   PATIENT NAME: Susan Bradley    MR#:  [id number]  DATE OF BIRTH:  1932-06-10  DATE OF ADMISSION:  10/06/2017 ADMITTING PHYSICIAN: Marguarite Arbour, MD  DATE OF DISCHARGE: 10/08/2017  PRIMARY CARE PHYSICIAN: Donita Brooks, MD    ADMISSION DIAGNOSIS:  Weakness [R53.1] Altered mental status, unspecified altered mental status type [R41.82]  DISCHARGE DIAGNOSIS:  Principal Problem:   Generalized weakness Active Problems:   Near syncope   Bradycardia   Ambulatory dysfunction   SECONDARY DIAGNOSIS:   Past Medical History:  Diagnosis Date  . CKD (chronic kidney disease) stage 3, GFR 30-59 ml/min (HCC)   . SAH (subarachnoid hemorrhage) (HCC)    2000    HOSPITAL COURSE:   83 year old female with a history of chronic kidney disease stage III, mild to moderate dementia and essential hypertension who presents with generalized weakness and confusion.  1. Generalized weakness: She had no focal infection source of generalized weakness. I'm suspecting bradycardia caused generalized weakness in addition to progressive dementia and mild dehydration. Patient was noted have sinus bradycardia and therefore beta blocker has been discontinued which could be contributing to generalized weakness. Head CT was negative. MRI brain was negative for acute pathology. She was evaluated by neurology.    2. Sinus bradycardia: Atenolol would be discontinued. She will outpatient follow-up with her primary care physician as well as cardiology. Her heart rates are now in the mid 60s. 3. Dementia: Continue Namenda 4. Depression: Continue Zoloft 5. Essential hypertension: Due to bradycardia atenolol has been discontinued Lisinopril dose has been increased to 40 mg daily. She can continue HCTZ. She needs to make sure that she has good hydration on a daily basis.  6. Glaucoma: Continue eyedrops     DISCHARGE CONDITIONS AND DIET:   Stable for  discharge on heart healthy diet  CONSULTS OBTAINED:  Treatment Team:  Dalia Heading, MD Pauletta Browns, MD  DRUG ALLERGIES:   Allergies  Allergen Reactions  . Seroquel [Quetiapine] Other (See Comments)    Makes pt's agitation worse.    DISCHARGE MEDICATIONS:   Current Discharge Medication List    CONTINUE these medications which have CHANGED   Details  lisinopril (PRINIVIL,ZESTRIL) 40 MG tablet Take 1 tablet (40 mg total) by mouth daily. Qty: 30 tablet, Refills: 0      CONTINUE these medications which have NOT CHANGED   Details  ALPRAZolam (XANAX) 0.5 MG tablet Take 0.5 mg by mouth 2 (two) times daily as needed for sleep or anxiety.    dorzolamide (TRUSOPT) 2 % ophthalmic solution Place 1 drop into both eyes 2 (two) times daily.    gabapentin (NEURONTIN) 100 MG capsule Take 2 capsules (200 mg total) 3 (three) times daily by mouth. Qty: 180 capsule, Refills: 5    hydrochlorothiazide (MICROZIDE) 12.5 MG capsule Take 12.5 mg by mouth daily.     memantine (NAMENDA) 10 MG tablet Take 10 mg by mouth 2 (two) times daily.     potassium chloride (K-DUR,KLOR-CON) 10 MEQ tablet Take 10 mEq by mouth daily.    ranitidine (ZANTAC) 300 MG tablet Take 300 mg by mouth daily.    sertraline (ZOLOFT) 100 MG tablet Take 2 tablets (200 mg total) daily by mouth. Qty: 60 tablet, Refills: 5    timolol (BETIMOL) 0.5 % ophthalmic solution Place 1 drop into both eyes 2 (two) times daily.      STOP taking these medications     atenolol (TENORMIN) 25  MG tablet           Today   CHIEF COMPLAINT:  No acute events overnight   VITAL SIGNS:  Blood pressure (!) 153/53, pulse 68, temperature (!) 97.4 F (36.3 C), temperature source Oral, resp. rate 17, height 5\' 4"  (1.626 m), weight 59.9 kg (132 lb), SpO2 98 %.   REVIEW OF SYSTEMS:  Review of Systems  Constitutional: Negative.  Negative for chills, fever and malaise/fatigue.  HENT: Negative.  Negative for ear discharge, ear  pain, hearing loss, nosebleeds and sore throat.   Eyes: Negative.  Negative for blurred vision and pain.  Respiratory: Negative.  Negative for cough, hemoptysis, shortness of breath and wheezing.   Cardiovascular: Negative.  Negative for chest pain, palpitations and leg swelling.  Gastrointestinal: Negative.  Negative for abdominal pain, blood in stool, diarrhea, nausea and vomiting.  Genitourinary: Negative.  Negative for dysuria.  Musculoskeletal: Negative.  Negative for back pain.  Skin: Negative.   Neurological: Negative for dizziness, tremors, speech change, focal weakness, seizures and headaches.  Endo/Heme/Allergies: Negative.  Does not bruise/bleed easily.  Psychiatric/Behavioral: Positive for memory loss. Negative for depression, hallucinations and suicidal ideas.     PHYSICAL EXAMINATION:  GENERAL:  83 y.o.-year-old patient lying in the bed with no acute distress.  NECK:  Supple, no jugular venous distention. No thyroid enlargement, no tenderness.  LUNGS: Normal breath sounds bilaterally, no wheezing, rales,rhonchi  No use of accessory muscles of respiration.  CARDIOVASCULAR: S1, S2 normal. No murmurs, rubs, or gallops.  ABDOMEN: Soft, non-tender, non-distended. Bowel sounds present. No organomegaly or mass.  EXTREMITIES: No pedal edema, cyanosis, or clubbing.  PSYCHIATRIC: The patient is alert and oriented x name SKIN: No obvious rash, lesion, or ulcer.   DATA REVIEW:   CBC Recent Labs  Lab 10/07/17 0500  WBC 5.8  HGB 11.6*  HCT 33.4*  PLT 232    Chemistries  Recent Labs  Lab 10/07/17 0500 10/08/17 0759  NA 140 139  K 3.9 3.6  CL 110 110  CO2 24 25  GLUCOSE 117* 86  BUN 25* 24*  CREATININE 1.04* 0.99  CALCIUM 8.4* 8.6*  AST 20  --   ALT 14  --   ALKPHOS 103  --   BILITOT 0.6  --     Cardiac Enzymes Recent Labs  Lab 10/06/17 1307  TROPONINI <0.03    Microbiology Results  @[email or web address] @  RADIOLOGY:  Dg Chest 2 View  Result Date:  10/06/2017 CLINICAL DATA:  Altered mental status, lethargy, and loss of balance today. EXAM: CHEST  2 VIEW COMPARISON:  None. FINDINGS: Cardiomediastinal silhouette is within normal limits for AP technique. Aortic atherosclerosis is noted. The lungs are hyperinflated with slight chronic appearing interstitial coarsening and minimal scattered lung scarring. No confluent airspace opacity, edema, pleural effusion, or pneumothorax is identified. No acute osseous abnormality is identified. IMPRESSION: Hyperinflation/COPD without evidence acute cardiopulmonary disease. Electronically Signed   By: Sebastian AcheAllen  Grady M.D.   On: 10/06/2017 14:14   Ct Head Wo Contrast  Result Date: 10/06/2017 CLINICAL DATA:  Altered mental status. EXAM: CT HEAD WITHOUT CONTRAST TECHNIQUE: Contiguous axial images were obtained from the base of the skull through the vertex without intravenous contrast. COMPARISON:  Brain MRI 08/03/2008 FINDINGS: Brain: There is mild right greater than left lateral ventriculomegaly which is increased from 2009. Ventricular enlargement is out of proportion to the degree of sulcal enlargement. Patchy subcortical and periventricular white matter hypodensities are greater than the extent of FLAIR/ T2 abnormality on the  prior MRI and are moderately advanced for age. There is no evidence of acute cortically based infarct, intracranial hemorrhage, mass, midline shift, or extra-axial fluid collection. Vascular: Calcified atherosclerosis at the skullbase. Skull: No fracture focal osseous lesion. Sinuses/Orbits: Visualized paranasal sinuses and mastoid air cells are clear. Bilateral cataract extraction. Other: None. IMPRESSION: 1. No evidence of acute infarct or hemorrhage. 2. Progressive cerebral white matter disease likely reflecting chronic small vessel ischemia. 3. Progressive mild lateral ventriculomegaly, favored to reflect central predominant cerebral atrophy or less likely hydrocephalus. Electronically Signed    By: Sebastian Ache M.D.   On: 10/06/2017 14:21   Mr Brain Wo Contrast  Result Date: 10/07/2017 CLINICAL DATA:  Increased somnolence/altered mental status. Generalized weakness. EXAM: MRI HEAD WITHOUT CONTRAST TECHNIQUE: Multiplanar, multiecho pulse sequences of the brain and surrounding structures were obtained without intravenous contrast. COMPARISON:  10/06/2017 head CT and 08/03/2008 MRI FINDINGS: Brain: There is no evidence of acute infarct, mass, midline shift, or extra-axial fluid collection. A few scattered chronic microhemorrhages are noted in the cerebral hemispheres. There is mild asymmetric right lateral ventriculomegaly with most notable dilatation of the atrium and temporal horn and new from the prior MRI. This is favored to be reflective of progressive cerebral atrophy including asymmetric right parietal white matter volume loss rather than hydrocephalus. Patchy subcortical and deep cerebral white matter T2 hyperintensities have progressed from the prior MRI and extend in a confluent manner to the subcortical white matter of the right parietal lobe, nonspecific but compatible with chronic small vessel ischemic disease. No definite cortical encephalomalacia is identified. Cerebral atrophy is overall moderately advanced for age. There is a developmental venous anomaly in the right cerebellum. Vascular: Major intracranial vascular flow voids are preserved. Skull and upper cervical spine: No suspicious marrow lesion. Sinuses/Orbits: Bilateral cataract extraction. No significant sinus disease. Other: None. IMPRESSION: 1. No acute intracranial abnormality. 2. Moderate chronic small vessel ischemic disease and cerebral atrophy, substantially from 2009. Electronically Signed   By: Sebastian Ache M.D.   On: 10/07/2017 12:06      Current Discharge Medication List    CONTINUE these medications which have CHANGED   Details  lisinopril (PRINIVIL,ZESTRIL) 40 MG tablet Take 1 tablet (40 mg total) by mouth  daily. Qty: 30 tablet, Refills: 0      CONTINUE these medications which have NOT CHANGED   Details  ALPRAZolam (XANAX) 0.5 MG tablet Take 0.5 mg by mouth 2 (two) times daily as needed for sleep or anxiety.    dorzolamide (TRUSOPT) 2 % ophthalmic solution Place 1 drop into both eyes 2 (two) times daily.    gabapentin (NEURONTIN) 100 MG capsule Take 2 capsules (200 mg total) 3 (three) times daily by mouth. Qty: 180 capsule, Refills: 5    hydrochlorothiazide (MICROZIDE) 12.5 MG capsule Take 12.5 mg by mouth daily.     memantine (NAMENDA) 10 MG tablet Take 10 mg by mouth 2 (two) times daily.     potassium chloride (K-DUR,KLOR-CON) 10 MEQ tablet Take 10 mEq by mouth daily.    ranitidine (ZANTAC) 300 MG tablet Take 300 mg by mouth daily.    sertraline (ZOLOFT) 100 MG tablet Take 2 tablets (200 mg total) daily by mouth. Qty: 60 tablet, Refills: 5    timolol (BETIMOL) 0.5 % ophthalmic solution Place 1 drop into both eyes 2 (two) times daily.      STOP taking these medications     atenolol (TENORMIN) 25 MG tablet  Management plans discussed with the patient's daughter and she is in agreement. [facility]  Patient should follow up with PCP and cardiology  CODE STATUS:     Code Status Orders  (From admission, onward)        Start     Ordered   10/06/17 1659  Full code  Continuous     10/06/17 1658    Code Status History    Date Active Date Inactive Code Status Order ID Comments User Context   This patient has a current code status but no historical code status.    Advance Directive Documentation     Most Recent Value  Type of Advance Directive  Healthcare Power of Attorney, Living will  Pre-existing out of facility DNR order (yellow form or pink MOST form)  No data  "MOST" Form in Place?  No data      TOTAL TIME TAKING CARE OF THIS PATIENT: 38 minutes.    Plan of care discussed with family prior to discharge. Family would  like patient to be discharged home with home health.  Note: This dictation was prepared with Dragon dictation along with smaller phrase technology. Any transcriptional errors that result from this process are unintentional.  Bettey Muraoka M.D on 10/08/2017 at 11:04 AM  Between 7am to 6pm - Pager - [phone] After 6pm go to [email or web address] - Social research officer, governmentpassword EPAS [facility]  [facility]  Office  [phone]  CC: Primary care physician; Donita BrooksPickard, Warren T, MD

## 2017-10-08 NOTE — Progress Notes (Signed)
Clinical Social Worker (CSW) contacted patient's daughter Jamesetta Sohyllis and made her aware that PT is recommending home health. Per daughter they have decided to take patient back home instead of pursuing ALF placement. Daughter prefers [facility] for home health. RN case manager aware of above. CSW will continue to follow and assist as needed.   Baker Hughes IncorporatedBailey Manford Sprong, LCSW [phone]

## 2017-10-08 NOTE — Progress Notes (Signed)
PHARMACIST - PHYSICIAN COMMUNICATION  CONCERNING: IV to Oral Route Change Policy  RECOMMENDATION: This patient is receiving Protonix by the intravenous route.  Based on criteria approved by the Pharmacy and Therapeutics Committee, the intravenous medication(s) is/are being converted to the equivalent oral dose form(s).   DESCRIPTION: These criteria include:  The patient is eating (either orally or via tube) and/or has been taking other orally administered medications for a least 24 hours  The patient has no evidence of active gastrointestinal bleeding or impaired GI absorption (gastrectomy, short bowel, patient on TNA or NPO).  If you have questions about this conversion, please contact the Pharmacy Department  []   [phone]( [phone] )  [facility] [x]   [phone]( [phone] )  [facility] []   [phone]( [phone] )  [facility] []   [phone]( [phone] )  [facility] []   [phone]( [phone] )  [facility]   Fed Ceci A, [facility] Hospital[facility] 10/08/2017 1:24 PM

## 2017-10-08 NOTE — Plan of Care (Signed)
  Education: Knowledge of General Education information will improve 10/08/2017 0700 - Progressing by Dwan BoltPage, Ernestina Joe L, RN   Progressing Education: Knowledge of General Education information will improve 10/08/2017 0700 - Progressing by Dwan BoltPage, Emmalene Kattner L, RN Health Behavior/Discharge Planning: Ability to manage health-related needs will improve 10/08/2017 0700 - Progressing by Dwan BoltPage, Yossi Hinchman L, RN Clinical Measurements: Ability to maintain clinical measurements within normal limits will improve 10/08/2017 0700 - Progressing by Dwan BoltPage, Madeline Bebout L, RN Will remain free from infection 10/08/2017 0700 - Progressing by Dwan BoltPage, Bradlee Heitman L, RN Diagnostic test results will improve 10/08/2017 0700 - Progressing by Dwan BoltPage, Mandel Seiden L, RN Respiratory complications will improve 10/08/2017 0700 - Progressing by Dwan BoltPage, Yasmyn Bellisario L, RN Cardiovascular complication will be avoided 10/08/2017 0700 - Progressing by Dwan BoltPage, Thaily Hackworth L, RN Activity: Risk for activity intolerance will decrease 10/08/2017 0700 - Progressing by Dwan BoltPage, Salley Boxley L, RN Nutrition: Adequate nutrition will be maintained 10/08/2017 0700 - Progressing by Dwan BoltPage, Nikoletta Varma L, RN Coping: Level of anxiety will decrease 10/08/2017 0700 - Progressing by Dwan BoltPage, Danella Philson L, RN Elimination: Will not experience complications related to bowel motility 10/08/2017 0700 - Progressing by Dwan BoltPage, Ajamu Maxon L, RN Will not experience complications related to urinary retention 10/08/2017 0700 - Progressing by Dwan BoltPage, Tiesha Marich L, RN Pain Managment: General experience of comfort will improve 10/08/2017 0700 - Progressing by Dwan BoltPage, Generoso Cropper L, RN Safety: Ability to remain free from injury will improve 10/08/2017 0700 - Progressing by Dwan BoltPage, Demichael Traum L, RN Skin Integrity: Risk for impaired skin integrity will decrease 10/08/2017 0700 - Progressing by Dwan BoltPage, Sharlett Lienemann L, RN

## 2017-10-08 NOTE — Care Management Note (Addendum)
Case Management Note  Patient Details  Name: Susan CivatteJoyce Kyler MRN: [id number] Date of Birth: 01/10/1932  Subjective/Objective:      [facility] diagnosis [facility] generalized weakness.               Action/Plan: Physical therapy evaluation completed. Recommending home with home health and physical therapy.  [facility] from [facility] indicated they don't take [employer] at this time due to staffing. Received telephone from MarianneJason, will be able to see Ms. Hagmann in the home due to the fact that they are already seeing Mr. Pardini   Expected Discharge Date:  10/08/17               Expected Discharge Plan:     In-House Referral:   yes  Discharge planning Services     Post Acute Care Choice:   yes Choice offered to:   daughter, [name]  DME Arranged:    DME Agency:     HH Arranged:   yes HH Agency:   [facility]  Status of Service:     If discussed at Long Length of Stay Meetings, dates discussed:    Additional Comments:  Gwenette GreetBrenda S Deolinda Frid, RN MSN CCM Care Management [phone] 10/08/2017, 12:19 PM

## 2017-10-08 NOTE — Progress Notes (Signed)
Pt has been discharged home. Discharge papers given and explained to pt's daughter. Daughter verbalized understanding. Meds and f/u appointments reviewed. RX given. Pt escorted in a wheelchair.

## 2017-10-08 NOTE — Progress Notes (Signed)
Echo showed preserved lv function with no significant valvular abnormalities. EF 60-65%. BLood pressure is elevated. Will remain off of atenolol for now and place on amlodipine 5 mg daily in addition to the increased dose of lisinopril and hctz. OK for discharge. Will see in the office in 1 week.

## 2017-10-08 NOTE — Progress Notes (Signed)
*  PRELIMINARY RESULTS* Echocardiogram 2D Echocardiogram has been performed.  Cristela BlueHege, Endora Teresi 10/08/2017, 11:51 AM

## 2017-10-10 NOTE — Progress Notes (Signed)
[facility]  Patient Status: Spoke to patients daughter about need for SN visits and she stated that she did not see any need because she was familiar with all her meds, and cognitive care as far as handling outburst and what to expect in the future, she also stated no need for HHA because her mother was doing her own self care. I will notify the MD and cancel SN services. Spoke with Andrey CampanileSandy RN at Dr Felisa BonierPickards office and let her know of above she will inform Dr Tanya NonesPickard. Daughter declined Physical Therapy post hospital for increased confusion. Daughter states patient is back to her baseline of dementia and ambulation; and no need for PT. Notified Terrilee CroakBrenda Holland, RNCM at [facility].    Dimple CaseyJason E Hinton 10/10/2017, 3:18 PM

## 2017-10-15 ENCOUNTER — Emergency Department
Admission: EM | Admit: 2017-10-15 | Discharge: 2017-10-26 | Disposition: A | Discharge status: Home / Self Care | Payer: Medicare HMO | Attending: Emergency Medicine | Admitting: Emergency Medicine

## 2017-10-15 ENCOUNTER — Other Ambulatory Visit: Payer: Self-pay

## 2017-10-15 ENCOUNTER — Emergency Department: Payer: Medicare HMO

## 2017-10-15 DIAGNOSIS — R4182 Altered mental status, unspecified: Secondary | ICD-10-CM | POA: Diagnosis present

## 2017-10-15 DIAGNOSIS — N183 Chronic kidney disease, stage 3 (moderate): Secondary | ICD-10-CM | POA: Diagnosis not present

## 2017-10-15 DIAGNOSIS — F0391 Unspecified dementia with behavioral disturbance: Secondary | ICD-10-CM | POA: Diagnosis not present

## 2017-10-15 DIAGNOSIS — G301 Alzheimer's disease with late onset: Secondary | ICD-10-CM | POA: Diagnosis not present

## 2017-10-15 DIAGNOSIS — J189 Pneumonia, unspecified organism: Secondary | ICD-10-CM | POA: Insufficient documentation

## 2017-10-15 DIAGNOSIS — Z79899 Other long term (current) drug therapy: Secondary | ICD-10-CM | POA: Insufficient documentation

## 2017-10-15 DIAGNOSIS — M6281 Muscle weakness (generalized): Secondary | ICD-10-CM | POA: Insufficient documentation

## 2017-10-15 DIAGNOSIS — R2689 Other abnormalities of gait and mobility: Secondary | ICD-10-CM | POA: Insufficient documentation

## 2017-10-15 DIAGNOSIS — E860 Dehydration: Secondary | ICD-10-CM | POA: Diagnosis not present

## 2017-10-15 DIAGNOSIS — Z87891 Personal history of nicotine dependence: Secondary | ICD-10-CM | POA: Diagnosis not present

## 2017-10-15 DIAGNOSIS — F0281 Dementia in other diseases classified elsewhere with behavioral disturbance: Secondary | ICD-10-CM | POA: Diagnosis not present

## 2017-10-15 DIAGNOSIS — F0390 Unspecified dementia without behavioral disturbance: Secondary | ICD-10-CM

## 2017-10-15 LAB — COMPREHENSIVE METABOLIC PANEL
ALBUMIN: 3.6 g/dL (ref 3.5–5.0)
ALK PHOS: 130 U/L — AB (ref 38–126)
ALT: 25 U/L (ref 14–54)
ANION GAP: 10 (ref 5–15)
AST: 34 U/L (ref 15–41)
BILIRUBIN TOTAL: 0.8 mg/dL (ref 0.3–1.2)
BUN: 51 mg/dL — ABNORMAL HIGH (ref 6–20)
CALCIUM: 9 mg/dL (ref 8.9–10.3)
CO2: 24 mmol/L (ref 22–32)
CREATININE: 1.5 mg/dL — AB (ref 0.44–1.00)
Chloride: 105 mmol/L (ref 101–111)
GFR calc Af Amer: 36 mL/min — ABNORMAL LOW (ref >60)
GFR calc non Af Amer: 31 mL/min — ABNORMAL LOW (ref >60)
Glucose, Bld: 111 mg/dL — ABNORMAL HIGH (ref 65–99)
Potassium: 3.9 mmol/L (ref 3.5–5.1)
Sodium: 139 mmol/L (ref 135–145)
TOTAL PROTEIN: 6.8 g/dL (ref 6.5–8.1)

## 2017-10-15 LAB — URINALYSIS, COMPLETE (UACMP) WITH MICROSCOPIC
BILIRUBIN URINE: NEGATIVE
Bacteria, UA: NONE SEEN
GLUCOSE, UA: NEGATIVE mg/dL
Hgb urine dipstick: NEGATIVE
Ketones, ur: NEGATIVE mg/dL
Leukocytes, UA: NEGATIVE
NITRITE: NEGATIVE
PH: 5 (ref 5.0–8.0)
Protein, ur: NEGATIVE mg/dL
SPECIFIC GRAVITY, URINE: 1.018 (ref 1.005–1.030)

## 2017-10-15 LAB — CBC
HCT: 30.6 % — ABNORMAL LOW (ref 35.0–47.0)
HEMOGLOBIN: 10.5 g/dL — AB (ref 12.0–16.0)
MCH: 32.1 pg (ref 26.0–34.0)
MCHC: 34.4 g/dL (ref 32.0–36.0)
MCV: 93.5 fL (ref 80.0–100.0)
PLATELETS: 258 10*3/uL (ref 150–440)
RBC: 3.27 MIL/uL — AB (ref 3.80–5.20)
RDW: 14 % (ref 11.5–14.5)
WBC: 7.1 10*3/uL (ref 3.6–11.0)

## 2017-10-15 MED ORDER — SODIUM CHLORIDE 0.9 % IV BOLUS (SEPSIS)
1000.0000 mL | Freq: Once | INTRAVENOUS | Status: AC
  Administered 2017-10-15: 1000 mL via INTRAVENOUS

## 2017-10-15 MED ORDER — ZIPRASIDONE MESYLATE 20 MG IM SOLR
10.0000 mg | Freq: Once | INTRAMUSCULAR | Status: AC
  Administered 2017-10-16: 10 mg via INTRAMUSCULAR
  Filled 2017-10-15: qty 20

## 2017-10-15 MED ORDER — ZIPRASIDONE MESYLATE 20 MG IM SOLR
10.0000 mg | Freq: Once | INTRAMUSCULAR | Status: AC
  Administered 2017-10-15: 10 mg via INTRAMUSCULAR
  Filled 2017-10-15: qty 20

## 2017-10-15 MED ORDER — OLANZAPINE 5 MG PO TBDP
5.0000 mg | ORAL_TABLET | Freq: Once | ORAL | Status: AC
  Administered 2017-10-15: 5 mg via ORAL
  Filled 2017-10-15: qty 1

## 2017-10-15 NOTE — ED Triage Notes (Addendum)
Pt arrives to ER via POV with daughter who states that patient has been combative, uncooperative, confused. This has been intermittent over the past week. Recent hospitalization for the same.  Pt alert to self. Disoriented to time, place and situation.   When asking daughter to explain more, she states that she would rather talk in private, "wants to keep the peace".  Pt cooperative with staff at this time.
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
## 2017-10-15 NOTE — ED Notes (Signed)
Pt moved to room 23.  Resumed care from [place] rn.  Pt dressed out and iv d'ced.  Pt alert and uncooperative.  Sitter with pt.  Pt has bed alarm on and fall socks on.  Side pads placed on rails.   Pt continues to try and get out of bed.

## 2017-10-15 NOTE — ED Notes (Signed)
Per family pt has hx of dementia. Pt has been increased AMS and combative with taking meds per family for " a while". Pt in NAD at this time, RR even and unlabored, pt calm and cooperative

## 2017-10-15 NOTE — BH Assessment (Signed)
Assessment Note  Chyrl CivatteJoyce Alberta is an 83 y.o. female. Ms. Carmela RimaShipp arrived to the ED by way of personal transportation from her daughter. She reports that she is here today because she wanted to visit today.  She states that she enjoyed coming down the hall and  seeing everybody. She states that the is no reason for her to be here today.  She was pleasant and speaking with a smile.  She denied symptoms of depression, and stated "I feel pretty good".  She denied symptoms of anxiety.  She denied having auditory or visual hallucinations.  She denied suicidal or homicidal ideation or intent.  She denied the use of alcohol or drugs. She denied facing stressors at this time.   Ms. Carmela RimaShipp demonstrated difficulty answering questions.  She would speak on various topics and not address what was asked.  TTS spoke with Shella Maximhyllis Hedrick (Daughter - [phone]) She reports that her mother was hitting herself in the head, refusing to take her medication, she would try to bite her hand when trying to get her to take her medication. Daughter is states that she is being seen by her [facility] (and her Son in law- [facility] [place]) for her treatment for dementia. Daughter reports that she was previously hospitalized due to being non responsive, within the last 2 weeks.  Daughter reports that she does not respond well to Haldol or Seroquel.  She reportedly becomes more agitated.  Diagnosis: Dementia  Past Medical History:  Past Medical History:  Diagnosis Date  . CKD (chronic kidney disease) stage 3, GFR 30-59 ml/min (HCC)   . SAH (subarachnoid hemorrhage) (HCC)    2000    Past Surgical History:  Procedure Laterality Date  . CHOLECYSTECTOMY      Family History: No family history on file.  Social History:  reports that she quit smoking about 2 months ago. she has never used smokeless tobacco. She reports that she does not drink alcohol or use drugs.  Additional  Social History:  Alcohol / Drug Use History of alcohol / drug use?: No history of alcohol / drug abuse  CIWA: CIWA-Ar BP: (!) 126/56 Pulse Rate: 63 COWS:    Allergies:  Allergies  Allergen Reactions  . Seroquel [Quetiapine] Other (See Comments)    Makes pt's agitation worse.    Home Medications:  (Not in a hospital admission)  OB/GYN Status:  No LMP recorded.  General Assessment Data [facility] Assessment: In system Is this a Tele or Face-to-Face Assessment?: Face-to-Face Is this an Initial Assessment or a Re-assessment for this encounter?: Initial Assessment Marital status: Married WestoverMaiden name: Brooke DareKing Is patient pregnant?: No Pregnancy Status: No Living Arrangements: Spouse/significant other, Children Can pt return to current living arrangement?: Yes Admission Status: Involuntary Is patient capable of signing voluntary admission?: Yes Referral Source: Self/Family/Friend Insurance type: Unsure  Medical Screening Exam [facility] Walk-in ONLY) Medical Exam completed: Yes  Crisis Care Plan Living Arrangements: Spouse/significant other, Children Legal Guardian: Other:(Self) Name of Psychiatrist: None Name of Therapist: None  Education Status Is patient currently in school?: No Current Grade: n/a Highest grade of school patient has completed: 10th Name of school: [employer] Contact person: n/a  Risk to self with the past 6 months Suicidal Ideation: No Has patient been a risk to self within the past 6 months prior to admission? : No Suicidal Intent: No Has patient had any suicidal intent within the past 6  months prior to admission? : No Is patient at risk for suicide?: No Suicidal Plan?: No Has patient had any suicidal plan within the past 6 months prior to admission? : No Access to Means: No What has been your use of drugs/alcohol within the last 12 months?: Denied use Previous Attempts/Gestures: No How many times?: 0 Other Self Harm Risks:  denied Triggers for Past Attempts: None known Intentional Self Injurious Behavior: None Family Suicide History: Unknown Recent stressful life event(s): (denied) Persecutory voices/beliefs?: No Depression: No Depression Symptoms: (denied) Substance abuse history and/or treatment for substance abuse?: No Suicide prevention information given to non-admitted patients: Not applicable  Risk to Others within the past 6 months Homicidal Ideation: No Does patient have any lifetime risk of violence toward others beyond the six months prior to admission? : No Thoughts of Harm to Others: No Current Homicidal Intent: No Current Homicidal Plan: No Access to Homicidal Means: No Identified Victim: None History of harm to others?: (aggressive today. -prior diagnosis of dementia) Assessment of Violence: On admission Does patient have access to weapons?: No Criminal Charges Pending?: No Does patient have a court date: No Is patient on probation?: No  Psychosis Hallucinations: None noted Delusions: None noted  Mental Status Report Appearance/Hygiene: Unremarkable Eye Contact: Good Motor Activity: Unremarkable Speech: Tangential Level of Consciousness: Alert Mood: Pleasant Affect: Appropriate to circumstance Anxiety Level: None Thought Processes: Tangential, Irrelevant Judgement: Unable to Assess Orientation: Not oriented Obsessive Compulsive Thoughts/Behaviors: None  Cognitive Functioning Concentration: Unable to Assess Memory: Recent Impaired, Remote Impaired IQ: Average Insight: Unable to Assess Impulse Control: Unable to Assess Appetite: Good Sleep: No Change Vegetative Symptoms: None  ADLScreening [facility] Assessment Services) Patient's cognitive ability adequate to safely complete daily activities?: No Patient able to express need for assistance with ADLs?: Yes Independently performs ADLs?: Yes (appropriate for developmental age)  Prior Inpatient Therapy Prior Inpatient  Therapy: No Prior Therapy Dates: n/a Prior Therapy Facilty/Provider(s): n/a Reason for Treatment: n/a  Prior Outpatient Therapy Prior Outpatient Therapy: No Prior Therapy Dates: n/a Prior Therapy Facilty/Provider(s): n/a Reason for Treatment: n/a Does patient have an ACCT team?: No Does patient have Intensive In-House Services?  : No Does patient have [facility] services? : No Does patient have P4CC services?: No  ADL Screening (condition at time of admission) Patient's cognitive ability adequate to safely complete daily activities?: No Is the patient deaf or have difficulty hearing?: No Does the patient have difficulty seeing, even when wearing glasses/contacts?: No Does the patient have difficulty concentrating, remembering, or making decisions?: Yes Patient able to express need for assistance with ADLs?: Yes Does the patient have difficulty dressing or bathing?: No Independently performs ADLs?: Yes (appropriate for developmental age)             Merchant navy officerAdvance Directives (For Healthcare) Does Patient Have a Medical Advance Directive?: Yes Type of Advance Directive: Healthcare Power of Attorney, Living will    Additional Information 1:1 In Past 12 Months?: No CIRT Risk: No Elopement Risk: No Does patient have medical clearance?: Yes     Disposition:  Disposition Initial Assessment Completed for this Encounter: Yes  On Site Evaluation by:   Reviewed with Physician:    Justice DeedsKeisha Zachary Lovins 10/15/2017 9:12 PM

## 2017-10-16 DIAGNOSIS — G301 Alzheimer's disease with late onset: Secondary | ICD-10-CM

## 2017-10-16 DIAGNOSIS — F0281 Dementia in other diseases classified elsewhere with behavioral disturbance: Secondary | ICD-10-CM

## 2017-10-16 DIAGNOSIS — F0390 Unspecified dementia without behavioral disturbance: Secondary | ICD-10-CM

## 2017-10-16 LAB — BASIC METABOLIC PANEL
ANION GAP: 11 (ref 5–15)
BUN: 35 mg/dL — AB (ref 6–20)
CHLORIDE: 108 mmol/L (ref 101–111)
CO2: 24 mmol/L (ref 22–32)
Calcium: 9.1 mg/dL (ref 8.9–10.3)
Creatinine, Ser: 0.96 mg/dL (ref 0.44–1.00)
GFR calc Af Amer: >60 mL/min (ref >60)
GFR, EST NON AFRICAN AMERICAN: 53 mL/min — AB (ref >60)
GLUCOSE: 95 mg/dL (ref 65–99)
POTASSIUM: 3.8 mmol/L (ref 3.5–5.1)
Sodium: 143 mmol/L (ref 135–145)

## 2017-10-16 MED ORDER — GABAPENTIN 100 MG PO CAPS
200.0000 mg | ORAL_CAPSULE | Freq: Three times a day (TID) | ORAL | Status: DC
  Administered 2017-10-16 – 2017-10-26 (×29): 200 mg via ORAL
  Filled 2017-10-16 (×37): qty 2

## 2017-10-16 MED ORDER — DIPHENHYDRAMINE HCL 50 MG/ML IJ SOLN
50.0000 mg | Freq: Once | INTRAMUSCULAR | Status: AC
  Administered 2017-10-16: 50 mg via INTRAMUSCULAR
  Filled 2017-10-16: qty 1

## 2017-10-16 NOTE — ED Notes (Signed)
[name] RN called pharmacy to obtain the Pt's medication since its not available in the ED.

## 2017-10-16 NOTE — ED Notes (Signed)
Patient assigned to appropriate care area. Patient oriented to unit/care area: Informed that, for their safety, care areas are designed for safety and monitored by security cameras at all times; and visiting hours explained to patient. Patient verbalizes understanding, and verbal contract for safety obtained. 

## 2017-10-16 NOTE — Consult Note (Signed)
Psychiatry: Chart reviewed, attempted to interview patient.  Case discussed with nursing and emergency room physician Dr. Mayford KnifeWilliams.  83 year old woman brought in last night with a reported complaint by family that she had been agitated and striking herself.  Patient reportedly was agitated enough that she was given sedating medicine over today I was unable to arouse her enough to have an interview with her.  Reportedly she has been sleeping for several hours this morning.  Labs most notably show a worsening of her renal function just in the last week but no sign of urinary tract infection.  The patient has an established past history of dementia.  No previous psychiatric notes that I can see.  Appears to have been on medication for dementia.  Full evaluation not possible at this time but suspect most likely an acute delirium possibly related to dehydration.  Patient is being rehydrated and will be reevaluated.  I will come back and try to interview her later in the day when she is awake.  I would hope that in this situation if she returns to baseline she may be able to be discharged rather than sent to geropsychiatric if it is simply a matter of a demented person with a superimposed transient delirium.  No change however at this point to the paperwork no orders written.

## 2017-10-16 NOTE — BH Assessment (Signed)
Per request of ER MD (Dr. Pershing ProudSchaevitz), writer contacted patient's daughter Jamesetta So(Phyllis [phone]). Writer informed her that patient was seen by the psychiatrist and wasn't recommended for inpatient and will be discharging. Daughter stated she "contested" the decision.  Writer reiterated Psych MD recommendation and she agained stated she was going to "contested."  Clinical research associateWriter updated ER MD Pershing Proud(Schaevitz) and the patient's nurse Sherilyn Cooter(Henry).

## 2017-10-16 NOTE — ED Notes (Signed)
Pt awake and still trying to get out of bed.  md aware.  Sitter with pt.

## 2017-10-16 NOTE — ED Provider Notes (Signed)
-----------------------------------------   8:20 AM on 10/16/2017 -----------------------------------------   Blood pressure (!) 126/56, pulse 63, temperature 98 F (36.7 C), temperature source Oral, resp. rate 18, height 5\' 4"  (1.626 m), weight 59.9 kg (132 lb), SpO2 98 %.  The patient had no acute events since last update.  Calm and cooperative at this time.    The patient did receive a dose of Geodon overnight as well as a dose of Benadryl prior to her falling asleep.  Otherwise the patient has no other concerns.   Rebecka ApleyWebster, Allison P, MD 10/16/17 [phone]

## 2017-10-16 NOTE — ED Notes (Signed)
ED BHU PLACEMENT JUSTIFICATION Is the patient under IVC or is there intent for IVC: No. Is the patient medically cleared: Yes.   Is there vacancy in the ED BHU: Yes.   Is the population mix appropriate for patient: Yes.   Is the patient awaiting placement in inpatient or outpatient setting: Yes.   Has the patient had a psychiatric consult: Yes.   Survey of unit performed for contraband, proper placement and condition of furniture, tampering with fixtures in bathroom, shower, and each patient room: Yes.  ; Findings: Environment secure.  APPEARANCE/BEHAVIOR calm, cooperative and adequate rapport can be established NEURO ASSESSMENT Orientation: place and person Hallucinations: No.None noted (Hallucinations) Speech: Normal Gait: unable to stand RESPIRATORY ASSESSMENT Normal expansion.  Clear to auscultation.  No rales, rhonchi, or wheezing., No chest wall tenderness. CARDIOVASCULAR ASSESSMENT regular rate and rhythm, S1, S2 normal, no murmur, click, rub or gallop GASTROINTESTINAL ASSESSMENT soft, nontender, BS WNL, no r/g EXTREMITIES no deformities, no erythema, induration, or nodules, no evidence of joint effusion PLAN OF CARE Provide calm/safe environment. Vital signs assessed twice daily. ED BHU Assessment once each 12-hour shift. Collaborate with intake RN daily or as condition indicates. Assure the ED provider has rounded once each shift. Provide and encourage hygiene. Provide redirection as needed. Assess for escalating behavior; address immediately and inform ED provider.  Assess family dynamic and appropriateness for visitation as needed: Yes.  ; If necessary, describe findings: Family is involved in the Pt's care.  Educate the patient/family about BHU procedures/visitation: Yes.

## 2017-10-16 NOTE — ED Notes (Signed)
Susan CooterHenry RN called the Pt's daughter to give her an update, but the she did not answered that phone.

## 2017-10-16 NOTE — ED Notes (Signed)
Pt attempting to get out of bed while this nurse taking report. Pt ambulated with nurse and NT to the restroom and placed back in bed with bed alarm attached. Pt appeared to be almost asleep as she moved back to the bed. Pt tucked in and sleeping at this time.

## 2017-10-16 NOTE — ED Notes (Signed)
Pt's daughter and POA, Susan Bradley, called to check on her mother. Informed her that pt is resting after a restless night. She had many questions about the process, which this nurse answered to the best of her knowledge. She is concerned that pt will be admitted to psych hospital, and this nurse informed her that we will be in contact with her before any transfer happens. She indicated that she has a meeting with social worker today and that she wants to be present for psychiatrist's assessment. This nurse explained that the psychiatrist will assess the patient one-on-one and make his recommendations. She also asked for a neuro assessment and stated that pt's neurologist will be coming to see pt while she is here. This nurse explained the process and that that we will be in touch with her and that we are keeping her mother safe; she is welcome to call at any time for updates.

## 2017-10-16 NOTE — ED Notes (Signed)
[name] RN received a call from Dr. Lockie ParesHendrick about the Pt. Dr. Lockie ParesHendrick wanted to get an update about the plan of care for the Pt and he stated that he was a part of the Pt's primary health care treatment team. [name] RN looked

## 2017-10-16 NOTE — ED Notes (Addendum)
Pt assisted to toilet; diaper changed, new blankets given. Pt states she feels better after eating and drinking. Still mumbles and doesn't seem to give appropriate answers to questions. Pt pleasant and cooperative. Asked pt if she desired a visit from her daughter and she smiled and said "maybe." Told pt we would leave lights on so she could stay awake for a while. Within moments of this conversation, pt sleeping and snoring.

## 2017-10-16 NOTE — ED Provider Notes (Signed)
-----------------------------------------   8:46 PM on 10/16/2017 -----------------------------------------   Blood pressure (!) 126/56, pulse 63, temperature 98 F (36.7 C), temperature source Oral, resp. rate 18, height 5\' 4"  (1.626 m), weight 59.9 kg (132 lb), SpO2 98 %.  The patient had no acute events since last update.  Calm and cooperative at this time.    Dr. Sander RadonGlidex has seen the patient and has taken the patient off commitment.  Dr. Toni Amendlapacs believes the patient's issues are mostly related to dementia and that she does not require inpatient geriatric psychiatry admission at this time.  Family was updated.  There was an issue with family members not knowing the "password."  Nursing worked with family to discuss relevant information.  Family will likely come in the morning to discuss further placement with both social work and psychiatry.    Myrna BlazerSchaevitz, [place] Matthew, MD 10/16/17 [id number]

## 2017-10-16 NOTE — ED Notes (Signed)
Report off to dawn rn  

## 2017-10-16 NOTE — ED Notes (Signed)
Pt asleep.  Sitter with pt.

## 2017-10-16 NOTE — ED Notes (Signed)
Haja Crego RN called the pharmacy to get the Pt's medication.  

## 2017-10-16 NOTE — ED Notes (Signed)
Pt continues to swing at staff, taking her clothes off. md aware.  meds given again.  Sitter with pt.

## 2017-10-16 NOTE — ED Notes (Signed)
Pt c/o of legs cramping up. This tech ambulated pt around the room. Pt ambulated w/assitance; pt is back in bed and resting.

## 2017-10-16 NOTE — ED Notes (Signed)
Daughter Jamesetta Sohyllis in lobby. Spoke with her and told her that pt is sleeping soundly and that Dr. Toni Amendlapacs had just tried to see her but was unable to awaken her enough to complete assessment. She decided to wait and try again later. She did state that she feels like it is important for her to have her gabapentin, as "that is the only thing that helps her" with dementia symptoms. I told her I will see about getting that to the pt. Daughter tearful upon departure.

## 2017-10-16 NOTE — ED Notes (Signed)
Dr Toni Amendlapacs attempted to assess pt; decided to come back because pt is too sleepy and won't wake up.

## 2017-10-16 NOTE — ED Notes (Signed)
Pt stated she needs to use bathroom. This tech put bedside toilet in room and helped pt ambulated out of bed to toilet. Pt appears very drowsy. Diaper changed on pt due to other being loose. Pt back in bed, covered, knees raised, bed locked, pads placed on both sides of bed. Pt is now sleeping.

## 2017-10-16 NOTE — ED Notes (Addendum)
Pt's daughter, Jamesetta Sohyllis, called and wanted update. Informed her that pt has slept all day, getting up only to use toilet. Pt was sleeping deeply and required sternal rub to awaken. She mumbles and was convinced to drink and eat. Pt responds "I don't know" when asked if she needed to use the toilet.   Daughter states that she does not want patient placed anywhere without a social work consult including her/her family. She does not think pt is able to return home and that she needs to be placed in a facility with a higher level of care (memory care). Pt also informed this nurse that Dr. Sherryll BurgerShah has asked the hospital neurologist (Dr. Thad Rangereynolds) to consult regarding pt.

## 2017-10-16 NOTE — ED Notes (Signed)
Pt awakened and given medications and water and dinner. Dr. Toni Amendlapacs attempting to assess pt; will wait to see if she is more coherent after she eats a little. Pt eating and drinking with no apparent difficulty.

## 2017-10-16 NOTE — ED Notes (Signed)
Someone who said her name is Susan Bradley called over the phone to get an update on the Pt. [name] RN asked the person for the password and the person stated that she did not received a password and that no one had asked for a password before. [name] RN told Susan Bradley that he has no way of verifying who she is over the phone therefor no information will be disclosed since it would be a HIPPA violation. Susan Bradley was upset about the situation and stated that she will be talking to administration.   Susan Bradley was rude throughout the conversation telling [name] RN what to do and barely letting him speak.

## 2017-10-16 NOTE — Consult Note (Signed)
[facility] Consult   Reason for Consult: Consult for 83 year old woman with a history of dementia brought in last night with some agitation Referring Physician:  Schaevitz Patient Identification: Susan Bradley MRN:  [id number] Principal Diagnosis: Dementia Diagnosis:   Patient Active Problem List   Diagnosis Date Noted  . Dementia [F03.90] 10/16/2017  . Near syncope [R55] 10/06/2017  . Generalized weakness [R53.1] 10/06/2017  . Bradycardia [R00.1] 10/06/2017  . Ambulatory dysfunction [R26.2] 10/06/2017  . CKD (chronic kidney disease) stage 3, GFR 30-59 ml/min (HCC) [N18.3]     Total Time spent with patient: 1 hour  Subjective:   Susan Bradley is a 83 y.o. female patient admitted with patient not able to give history.  HPI: Patient seen and attempted interview.  Chart reviewed.  Spoke with nursing and emergency room physician.  83 year old woman with a known history of dementia was brought to the emergency room last night with some reported agitation.  Apparently daughter had reported that the patient had been agitated and difficult to manage at home.  I am told that when she came in last night the patient was somewhat agitated.  Not assaultive not threatening not violent but trying to get out of bed and uncooperative.  She was ultimately given sedating medication and slept through the night.  This evening I saw the patient and she was sitting up in bed eating.  She is nonverbal right now not able to give any information or answer any questions except with muttering.  Social history: Apparently lives with daughter.  Medical history: Chronic kidney disease with recent worsening renal function over the last week.  Probably dehydration.  Substance abuse history: None  Past Psychiatric History: No known past psychiatric history other than a known diagnosis of dementia  Risk to Self: Suicidal Ideation: No Suicidal Intent: No Is patient at risk for suicide?: No Suicidal  Plan?: No Access to Means: No What has been your use of drugs/alcohol within the last 12 months?: Denied use How many times?: 0 Other Self Harm Risks: denied Triggers for Past Attempts: None known Intentional Self Injurious Behavior: None Risk to Others: Homicidal Ideation: No Thoughts of Harm to Others: No Current Homicidal Intent: No Current Homicidal Plan: No Access to Homicidal Means: No Identified Victim: None History of harm to others?: (aggressive today. -prior diagnosis of dementia) Assessment of Violence: On admission Does patient have access to weapons?: No Criminal Charges Pending?: No Does patient have a court date: No Prior Inpatient Therapy: Prior Inpatient Therapy: No Prior Therapy Dates: n/a Prior Therapy Facilty/Provider(s): n/a Reason for Treatment: n/a Prior Outpatient Therapy: Prior Outpatient Therapy: No Prior Therapy Dates: n/a Prior Therapy Facilty/Provider(s): n/a Reason for Treatment: n/a Does patient have an ACCT team?: No Does patient have Intensive In-House Services?  : No Does patient have [facility] services? : No Does patient have P4CC services?: No  Past Medical History:  Past Medical History:  Diagnosis Date  . CKD (chronic kidney disease) stage 3, GFR 30-59 ml/min (HCC)   . SAH (subarachnoid hemorrhage) ([place])    2000    Past Surgical History:  Procedure Laterality Date  . CHOLECYSTECTOMY     Family History: No family history on file. Family Psychiatric  History: None known Social History:  Social History   Substance and Sexual Activity  Alcohol Use No     Social History   Substance and Sexual Activity  Drug Use No    Social History   Socioeconomic History  . Marital status: Married  Spouse name: None  . Number of children: None  . Years of education: None  . Highest education level: None  Social Needs  . Financial resource strain: Not very hard  . Food insecurity - worry: Never true  . Food insecurity - inability:  Never true  . Transportation needs - medical: No  . Transportation needs - non-medical: None  Occupational History  . None  Tobacco Use  . Smoking status: Former Smoker    Last attempt to quit: 08/06/2017    Years since quitting: 0.1  . Smokeless tobacco: Never Used  Substance and Sexual Activity  . Alcohol use: No  . Drug use: No  . Sexual activity: No  Other Topics Concern  . None  Social History Narrative  . None   Additional Social History:    Allergies:   Allergies  Allergen Reactions  . Haldol [Haloperidol Lactate] Other (See Comments)    agitation  . Seroquel [Quetiapine] Other (See Comments)    Makes pt's agitation worse.    Labs:  Results for orders placed or performed during the hospital encounter of 10/15/17 (from the past 48 hour(s))  Comprehensive metabolic panel     Status: Abnormal   Collection Time: 10/15/17  6:47 PM  Result Value Ref Range   Sodium 139 135 - 145 mmol/L   Potassium 3.9 3.5 - 5.1 mmol/L   Chloride 105 101 - 111 mmol/L   CO2 24 22 - 32 mmol/L   Glucose, Bld 111 (H) 65 - 99 mg/dL   BUN 51 (H) 6 - 20 mg/dL   Creatinine, Ser 1.50 (H) 0.44 - 1.00 mg/dL   Calcium 9.0 8.9 - 10.3 mg/dL   Total Protein 6.8 6.5 - 8.1 g/dL   Albumin 3.6 3.5 - 5.0 g/dL   AST 34 15 - 41 U/L   ALT 25 14 - 54 U/L   Alkaline Phosphatase 130 (H) 38 - 126 U/L   Total Bilirubin 0.8 0.3 - 1.2 mg/dL   GFR calc non Af Amer 31 (L) >60 mL/min   GFR calc Af Amer 36 (L) >60 mL/min    Comment: (NOTE) The eGFR has been calculated using the CKD EPI equation. This calculation has not been validated in all clinical situations. eGFR's persistently <60 mL/min signify possible Chronic Kidney Disease.    Anion gap 10 5 - 15  CBC     Status: Abnormal   Collection Time: 10/15/17  6:47 PM  Result Value Ref Range   WBC 7.1 3.6 - 11.0 K/uL   RBC 3.27 (L) 3.80 - 5.20 MIL/uL   Hemoglobin 10.5 (L) 12.0 - 16.0 g/dL   HCT 30.6 (L) 35.0 - 47.0 %   MCV 93.5 80.0 - 100.0 fL   MCH  32.1 26.0 - 34.0 pg   MCHC 34.4 32.0 - 36.0 g/dL   RDW 14.0 11.5 - 14.5 %   Platelets 258 150 - 440 K/uL  Urinalysis, Complete w Microscopic     Status: Abnormal   Collection Time: 10/15/17  7:26 PM  Result Value Ref Range   Color, Urine YELLOW (A) YELLOW   APPearance CLEAR (A) CLEAR   Specific Gravity, Urine 1.018 1.005 - 1.030   pH 5.0 5.0 - 8.0   Glucose, UA NEGATIVE NEGATIVE mg/dL   Hgb urine dipstick NEGATIVE NEGATIVE   Bilirubin Urine NEGATIVE NEGATIVE   Ketones, ur NEGATIVE NEGATIVE mg/dL   Protein, ur NEGATIVE NEGATIVE mg/dL   Nitrite NEGATIVE NEGATIVE   Leukocytes, UA NEGATIVE NEGATIVE  RBC / HPF 0-5 0 - 5 RBC/hpf   WBC, UA 0-5 0 - 5 WBC/hpf   Bacteria, UA NONE SEEN NONE SEEN   Squamous Epithelial / LPF 0-5 (A) NONE SEEN   Mucus PRESENT    Hyaline Casts, UA PRESENT   Basic metabolic panel     Status: Abnormal   Collection Time: 10/16/17 11:20 AM  Result Value Ref Range   Sodium 143 135 - 145 mmol/L   Potassium 3.8 3.5 - 5.1 mmol/L   Chloride 108 101 - 111 mmol/L   CO2 24 22 - 32 mmol/L   Glucose, Bld 95 65 - 99 mg/dL   BUN 35 (H) 6 - 20 mg/dL   Creatinine, Ser 0.96 0.44 - 1.00 mg/dL   Calcium 9.1 8.9 - 10.3 mg/dL   GFR calc non Af Amer 53 (L) >60 mL/min   GFR calc Af Amer >60 >60 mL/min    Comment: (NOTE) The eGFR has been calculated using the CKD EPI equation. This calculation has not been validated in all clinical situations. eGFR's persistently <60 mL/min signify possible Chronic Kidney Disease.    Anion gap 11 5 - 15    Current Facility-Administered Medications  Medication Dose Route Frequency Provider Last Rate Last Dose  . gabapentin (NEURONTIN) capsule 200 mg  200 mg Oral TID Earleen Newport, MD   200 mg at 10/16/17 1806   Current Outpatient Medications  Medication Sig Dispense Refill  . ALPRAZolam (XANAX) 0.5 MG tablet Take 0.5 mg by mouth 2 (two) times daily as needed for sleep or anxiety.    Marland Kitchen amLODipine (NORVASC) 5 MG tablet Take 5 mg by  mouth daily.    . dorzolamide (TRUSOPT) 2 % ophthalmic solution Place 1 drop into both eyes 2 (two) times daily.    Marland Kitchen gabapentin (NEURONTIN) 100 MG capsule Take 2 capsules (200 mg total) 3 (three) times daily by mouth. 180 capsule 5  . hydrochlorothiazide (MICROZIDE) 12.5 MG capsule Take 12.5 mg by mouth daily.     Marland Kitchen lisinopril (PRINIVIL,ZESTRIL) 40 MG tablet Take 1 tablet (40 mg total) by mouth daily. 30 tablet 0  . memantine (NAMENDA) 10 MG tablet Take 10 mg by mouth 2 (two) times daily.     . potassium chloride (K-DUR,KLOR-CON) 10 MEQ tablet Take 10 mEq by mouth daily.    . ranitidine (ZANTAC) 300 MG tablet Take 300 mg by mouth daily.    . sertraline (ZOLOFT) 100 MG tablet Take 2 tablets (200 mg total) daily by mouth. 60 tablet 5  . timolol (BETIMOL) 0.5 % ophthalmic solution Place 1 drop into both eyes 2 (two) times daily.      Musculoskeletal: Strength & Muscle Tone: decreased Gait & Station: unable to stand Patient leans: N/A  Psychiatric Specialty Exam: Physical Exam  Nursing note and vitals reviewed. Constitutional: She appears well-developed and well-nourished.  HENT:  Head: Normocephalic and atraumatic.  Eyes: Conjunctivae are normal. Pupils are equal, round, and reactive to light.  Neck: Normal range of motion.  Cardiovascular: Regular rhythm and normal heart sounds.  Respiratory: Effort normal. No respiratory distress.  GI: Soft.  Musculoskeletal: Normal range of motion.  Neurological: She is alert.  Skin: Skin is warm and dry.  Psychiatric: Her affect is blunt. She is slowed. Cognition and memory are impaired. She is noncommunicative. She exhibits abnormal recent memory and abnormal remote memory.    Review of Systems  Unable to perform ROS: Mental acuity    Blood pressure (!) 126/56, pulse 63,  temperature 98 F (36.7 C), temperature source Oral, resp. rate 18, height 5' 4"  (1.626 m), weight 132 lb (59.9 kg), SpO2 98 %.Body mass index is 22.66 kg/m.  General  Appearance: Disheveled  Eye Contact:  Minimal  Speech:  Slow  Volume:  Decreased  Mood:  Negative  Affect:  Negative  Thought Process:  NA  Orientation:  Negative  Thought Content:  Negative  Suicidal Thoughts:  No  Homicidal Thoughts:  No  Memory:  Negative  Judgement:  Negative  Insight:  Negative  Psychomotor Activity:  Negative  Concentration:  Concentration: Poor  Recall:  Poor  Fund of Knowledge:  Poor  Language:  Poor  Akathisia:  No  Handed:  Right  AIMS (if indicated):     Assets:  Social Support  ADL's:  Impaired  Cognition:  Impaired,  Moderate and Severe  Sleep:        Treatment Plan Summary: Plan Patient with dementia apparently with recent worsening most likely judging from her labs from transient dehydration.  Patient is now awake and alert and cooperative.  Not violent or threatening.  There is no indication at this point for psychiatric hospitalization.  Discontinue IVC.  Recommend that family can continue to look into the possibility of assisted living level care if she proves difficult to manage at home.  No new prescriptions were written.  Case reviewed with nursing and emergency room physician.  Disposition: Patient does not meet criteria for psychiatric inpatient admission.  Alethia Berthold, MD 10/16/2017 7:08 PM

## 2017-10-16 NOTE — ED Notes (Signed)
Pt assisted out of bed to use bedside toilet. Bed was wet and pt would not sit on toilet. Pt kept whispering she has to pee. RN, Noreene LarssonJill assisted this tech with getting pt on toilet and changing bed. Pt is back in bed and resting at this time.

## 2017-10-16 NOTE — Clinical Social Work Note (Signed)
CSW received inappropriate consult for "geripsych placement." TTS has been consulted and is aware of this need. CSW awaiting psych eval. CSW signing off as no further Social Work needs identified. Please reconsult if new Social Work needs arise.   Corlis HoveJeneya Henderson [place], Theresia MajorsLCSWA, [facility] Clinical Social Worker-ED [phone]

## 2017-10-16 NOTE — ED Notes (Signed)
Lunch tray placed at bedside. Pt is sleeping

## 2017-10-16 NOTE — ED Notes (Signed)
Labs drawn. Pt cooperated and went back to sleep.

## 2017-10-17 ENCOUNTER — Emergency Department (EMERGENCY_DEPARTMENT_HOSPITAL): Payer: Medicare HMO

## 2017-10-17 DIAGNOSIS — F0391 Unspecified dementia with behavioral disturbance: Secondary | ICD-10-CM

## 2017-10-17 DIAGNOSIS — F0281 Dementia in other diseases classified elsewhere with behavioral disturbance: Secondary | ICD-10-CM | POA: Diagnosis not present

## 2017-10-17 DIAGNOSIS — G301 Alzheimer's disease with late onset: Secondary | ICD-10-CM | POA: Diagnosis not present

## 2017-10-17 MED ORDER — GABAPENTIN 100 MG PO CAPS: 200.0000 mg | ORAL_CAPSULE | Freq: Three times a day (TID) | ORAL | Status: DC

## 2017-10-17 MED ORDER — MIDAZOLAM HCL 2 MG/2ML IJ SOLN
2.0000 mg | Freq: Once | INTRAMUSCULAR | Status: AC
  Administered 2017-10-17: 2 mg via INTRAMUSCULAR

## 2017-10-17 MED ORDER — HYDROCHLOROTHIAZIDE 12.5 MG PO CAPS
12.5000 mg | ORAL_CAPSULE | Freq: Every day | ORAL | Status: DC
  Administered 2017-10-17 – 2017-10-26 (×9): 12.5 mg via ORAL
  Filled 2017-10-17 (×9): qty 1

## 2017-10-17 MED ORDER — TIMOLOL MALEATE 0.5 % OP SOLN
1.0000 [drp] | Freq: Two times a day (BID) | OPHTHALMIC | Status: DC
  Administered 2017-10-17 – 2017-10-26 (×16): 1 [drp] via OPHTHALMIC
  Filled 2017-10-17: qty 5

## 2017-10-17 MED ORDER — TIMOLOL HEMIHYDRATE 0.5 % OP SOLN: 1.0000 [drp] | Freq: Two times a day (BID) | OPHTHALMIC | Status: DC

## 2017-10-17 MED ORDER — DORZOLAMIDE HCL 2 % OP SOLN
1.0000 [drp] | Freq: Two times a day (BID) | OPHTHALMIC | Status: DC
  Administered 2017-10-17 – 2017-10-26 (×17): 1 [drp] via OPHTHALMIC
  Filled 2017-10-17: qty 10

## 2017-10-17 MED ORDER — ALPRAZOLAM 0.5 MG PO TABS
0.5000 mg | ORAL_TABLET | Freq: Two times a day (BID) | ORAL | Status: DC | PRN
  Filled 2017-10-17: qty 1

## 2017-10-17 MED ORDER — FAMOTIDINE 20 MG PO TABS
40.0000 mg | ORAL_TABLET | Freq: Every day | ORAL | Status: DC
  Administered 2017-10-17 – 2017-10-26 (×10): 40 mg via ORAL
  Filled 2017-10-17 (×10): qty 2

## 2017-10-17 MED ORDER — SERTRALINE HCL 50 MG PO TABS
200.0000 mg | ORAL_TABLET | Freq: Every day | ORAL | Status: DC
  Administered 2017-10-17 – 2017-10-26 (×10): 200 mg via ORAL
  Filled 2017-10-17 (×10): qty 4

## 2017-10-17 MED ORDER — LISINOPRIL 10 MG PO TABS
40.0000 mg | ORAL_TABLET | Freq: Every day | ORAL | Status: DC
  Administered 2017-10-17 – 2017-10-26 (×9): 40 mg via ORAL
  Filled 2017-10-17 (×9): qty 4

## 2017-10-17 MED ORDER — AMLODIPINE BESYLATE 5 MG PO TABS
5.0000 mg | ORAL_TABLET | Freq: Every day | ORAL | Status: DC
  Administered 2017-10-17 – 2017-10-26 (×9): 5 mg via ORAL
  Filled 2017-10-17 (×9): qty 1

## 2017-10-17 MED ORDER — MIDAZOLAM HCL 5 MG/5ML IJ SOLN
INTRAMUSCULAR | Status: AC
  Administered 2017-10-17: 2 mg via INTRAMUSCULAR
  Filled 2017-10-17: qty 5

## 2017-10-17 MED ORDER — MEMANTINE HCL 5 MG PO TABS
10.0000 mg | ORAL_TABLET | Freq: Two times a day (BID) | ORAL | Status: DC
  Administered 2017-10-17 – 2017-10-26 (×19): 10 mg via ORAL
  Filled 2017-10-17 (×19): qty 2

## 2017-10-17 MED ORDER — POTASSIUM CHLORIDE CRYS ER 20 MEQ PO TBCR
10.0000 meq | EXTENDED_RELEASE_TABLET | Freq: Every day | ORAL | Status: DC
  Administered 2017-10-17 – 2017-10-26 (×10): 10 meq via ORAL
  Filled 2017-10-17 (×11): qty 1

## 2017-10-17 NOTE — ED Notes (Signed)
Patient transported to EEG

## 2017-10-17 NOTE — ED Notes (Signed)
Pt taking meds without complication. Pt pleasant and denies current needs, pt has preoccupation with folding blanket and reassured by this RN

## 2017-10-17 NOTE — ED Notes (Signed)
Bed alarm remains in place.

## 2017-10-17 NOTE — ED Notes (Signed)
Pt sleeping and was not disturbed for vitals at this time due to being restless.

## 2017-10-17 NOTE — NC FL2 (Signed)
[place] MEDICAID FL2 LEVEL OF CARE SCREENING TOOL     IDENTIFICATION  Patient Name: Susan Bradley Birthdate: Feb 03, 1932 Sex: female Admission Date (Current Location): 10/15/2017  [place]   Facility and Address:  [facility], [address], [place], [place] [address]      Provider Number: [id number]  Attending Physician Name and Address:  No att. providers found  Relative Name and Phone Number:       Current Level of Care: Hospital Recommended Level of Care: Skilled Nursing Facility Prior Approval Number:    Date Approved/Denied:   PASRR Number: [id number]  Discharge Plan: SNF    Current Diagnoses: Patient Active Problem List   Diagnosis Date Noted  . Dementia 10/16/2017  . Near syncope 10/06/2017  . Generalized weakness 10/06/2017  . Bradycardia 10/06/2017  . Ambulatory dysfunction 10/06/2017  . CKD (chronic kidney disease) stage 3, GFR 30-59 ml/min (HCC)     Orientation RESPIRATION BLADDER Height & Weight     Self  Normal Continent Weight: 132 lb (59.9 kg) Height:  5\' 4"  (162.6 cm)  BEHAVIORAL SYMPTOMS/MOOD NEUROLOGICAL BOWEL NUTRITION STATUS  Wanderer   Continent Diet(Normal Diet)  AMBULATORY STATUS COMMUNICATION OF NEEDS Skin   Limited Assist Verbally Normal                       Personal Care Assistance Level of Assistance  Bathing, Feeding, Dressing Bathing Assistance: Maximum assistance Feeding assistance: Limited assistance Dressing Assistance: Maximum assistance     Functional Limitations Info  Sight, Hearing, Speech Sight Info: Adequate Hearing Info: Adequate Speech Info: Adequate    SPECIAL CARE FACTORS FREQUENCY                       Contractures Contractures Info: Not present    Additional Factors Info  Code Status, Allergies Code Status Info: Full Allergies Info: Haldol Haloperidol Lactate, Seroquel Quetiapine           Current Medications (10/17/2017):   This is the current hospital active medication list Current Facility-Administered Medications  Medication Dose Route Frequency Provider Last Rate Last Dose  . ALPRAZolam Prudy Feeler(XANAX) tablet 0.5 mg  0.5 mg Oral BID PRN Sharman CheekStafford, Phillip, MD      . amLODipine (NORVASC) tablet 5 mg  5 mg Oral Daily Sharman CheekStafford, Phillip, MD   5 mg at 10/17/17 1036  . dorzolamide (TRUSOPT) 2 % ophthalmic solution 1 drop  1 drop Both Eyes BID Sharman CheekStafford, Phillip, MD      . famotidine (PEPCID) tablet 40 mg  40 mg Oral Daily Sharman CheekStafford, Phillip, MD   40 mg at 10/17/17 1036  . gabapentin (NEURONTIN) capsule 200 mg  200 mg Oral TID Emily FilbertWilliams, Jonathan E, MD   200 mg at 10/17/17 1035  . gabapentin (NEURONTIN) capsule 200 mg  200 mg Oral TID Sharman CheekStafford, Phillip, MD      . hydrochlorothiazide (MICROZIDE) capsule 12.5 mg  12.5 mg Oral Daily Sharman CheekStafford, Phillip, MD   12.5 mg at 10/17/17 1036  . lisinopril (PRINIVIL,ZESTRIL) tablet 40 mg  40 mg Oral Daily Sharman CheekStafford, Phillip, MD   40 mg at 10/17/17 1034  . memantine (NAMENDA) tablet 10 mg  10 mg Oral BID Sharman CheekStafford, Phillip, MD   10 mg at 10/17/17 1036  . potassium chloride SA (K-DUR,KLOR-CON) CR tablet 10 mEq  10 mEq Oral Daily Sharman CheekStafford, Phillip, MD   10 mEq at 10/17/17 1036  . sertraline (ZOLOFT) tablet 200 mg  200 mg Oral Daily Sharman CheekStafford, Phillip, MD   200 mg at 10/17/17 1033  . timolol (BETIMOL) 0.5 % ophthalmic solution 1 drop  1 drop Both Eyes BID Sharman CheekStafford, Phillip, MD       Current Outpatient Medications  Medication Sig Dispense Refill  . ALPRAZolam (XANAX) 0.5 MG tablet Take 0.5 mg by mouth 2 (two) times daily as needed for sleep or anxiety.    Marland Kitchen. amLODipine (NORVASC) 5 MG tablet Take 5 mg by mouth daily.    . dorzolamide (TRUSOPT) 2 % ophthalmic solution Place 1 drop into both eyes 2 (two) times daily.    Marland Kitchen. gabapentin (NEURONTIN) 100 MG capsule Take 2 capsules (200 mg total) 3 (three) times daily by mouth. 180 capsule 5  . hydrochlorothiazide (MICROZIDE) 12.5 MG capsule Take 12.5 mg by  mouth daily.     Marland Kitchen. lisinopril (PRINIVIL,ZESTRIL) 40 MG tablet Take 1 tablet (40 mg total) by mouth daily. 30 tablet 0  . memantine (NAMENDA) 10 MG tablet Take 10 mg by mouth 2 (two) times daily.     . potassium chloride (K-DUR,KLOR-CON) 10 MEQ tablet Take 10 mEq by mouth daily.    . ranitidine (ZANTAC) 300 MG tablet Take 300 mg by mouth daily.    . sertraline (ZOLOFT) 100 MG tablet Take 2 tablets (200 mg total) daily by mouth. 60 tablet 5  . timolol (BETIMOL) 0.5 % ophthalmic solution Place 1 drop into both eyes 2 (two) times daily.       Discharge Medications: Please see discharge summary for a list of discharge medications.  Relevant Imaging Results:  Relevant Lab Results:   Additional Information SSN: [id number]  Dominic PeaJeneya G Evette Diclemente, LCSW

## 2017-10-17 NOTE — ED Notes (Signed)
Patient agitated.  Setting off bed alarm.  Attempted to re-direct patient with no success.  Patient ambulated to BR with assist.  While in bathroom patient agitation and confusion continued to escalate. Patient voided and moved bowels.  Walking patient back to room, patient agitation increased.  Patient refusing to return to room and bed.  Repeatedly explained the reason for staying in room and bed-- safety concerns and quad area rules.  Patient not understanding.  Speaking in jumbles stating "why don't you just go to your room then."  Dr. Roxan Hockeyobinson alerted.  Attempted to given patient ordered PO Xanax.  Patient refused to take medication and patient attempted to twist this RN fingers while medication being offered to patient. Dr. Roxan Hockeyobinson alerted.  Multiple attempts still being made by multiple staff members, unsuccessfully, to have patient return to bed and safety.  Versed ordered and administered.  Patient sitting on side of bed, resisting sitting, when injection given.  Bed alarm back on patient.  Side rails up for safety.  Continue to monitor.

## 2017-10-17 NOTE — Evaluation (Signed)
Physical Therapy Evaluation Patient Details Name: Susan Bradley MRN: [id number] DOB: Apr 03, 1932 Today's Date: 10/17/2017   History of Present Illness  Pt is an 83 y.o. female presenting to ED with AMS/increased aggressive behavior with behavioral disturbances.  PMH includes CKD, SAH, dementia, near syncope, bradycardia.  Clinical Impression  Prior to hospital admission, pt was ambulatory (pt reports no use of assistive device).  Pt lives with family.  Currently pt is independent with bed mobility; CGA with transfers; and CGA to min assist with ambulation.  Trialed pt with no AD ambulating but pt unsteady requiring min assist to steady; trialed pt with RW but pt with difficulty navigating RW, staying within RW, and staying close to RW with max cues during session (requiring CGA to min assist for balance and safety).  Pt would benefit from skilled PT to address noted impairments and functional limitations (see below for any additional details).  Upon hospital discharge, recommend pt discharge to STR to focus on balance, strengthening, and increasing independence with ambulation (with least restrictive AD).    Follow Up Recommendations SNF    Equipment Recommendations  Rolling walker with 5" wheels    Recommendations for Other Services       Precautions / Restrictions Precautions Precautions: Fall Restrictions Weight Bearing Restrictions: No      Mobility  Bed Mobility Overal bed mobility: Independent             General bed mobility comments: Supine to/from sit without any difficulties.  Transfers Overall transfer level: Needs assistance Equipment used: None Transfers: Sit to/from Stand Sit to Stand: Min guard         General transfer comment: CGA for safety; mild increased effort to stand  Ambulation/Gait Ambulation/Gait assistance: Min guard;Min assist Ambulation Distance (Feet): (10 feet no AD; 100 feet with RW) Assistive device: None;Rolling walker (2 wheeled)    Gait velocity: decreased   General Gait Details: pt unsteady, losing balance to R with no AD so trial RW; pt requiring consistent vc's for safe technique with use of RW (stay closer to RW, stay within RW, steering RW)  Stairs            Wheelchair Mobility    Modified Rankin (Stroke Patients Only)       Balance Overall balance assessment: Needs assistance Sitting-balance support: No upper extremity supported;Feet supported Sitting balance-Leahy Scale: Good Sitting balance - Comments: sitting reaching within BOS   Standing balance support: No upper extremity supported;During functional activity Standing balance-Leahy Scale: Poor Standing balance comment: requires min assist to steady ambulating with no AD                             Pertinent Vitals/Pain Pain Assessment: 0-10 Pain Score: 2  Pain Location: R shoulder Pain Descriptors / Indicators: Sore Pain Intervention(s): Limited activity within patient's tolerance;Monitored during session;Repositioned  Vitals (HR and O2 on room air) stable and WFL throughout treatment session.    Home Living Family/patient expects to be discharged to:: Private residence Living Arrangements: Spouse/significant other Available Help at Discharge: Family Type of Home: House Home Access: Level entry     Home Layout: One level Home Equipment: (husband has RW)      Prior Function           Comments: Per recent PT note, pt has been independent with mobility (cognition more of her limiting factor) per family.  Pt denies any recent falls.     Hand  Dominance        Extremity/Trunk Assessment   Upper Extremity Assessment Upper Extremity Assessment: Generalized weakness    Lower Extremity Assessment Lower Extremity Assessment: Generalized weakness       Communication   Communication: No difficulties  Cognition Arousal/Alertness: Awake/alert Behavior During Therapy: WFL for tasks assessed/performed Overall  Cognitive Status: No family/caregiver present to determine baseline cognitive functioning(Oriented to self and place)                                        General Comments General comments (skin integrity, edema, etc.): Pt resting on stretcher bed upon PT entry.  Nursing cleared pt for participation in physical therapy.  Pt agreeable to PT session.    Exercises  Gait training with RW   Assessment/Plan    PT Assessment Patient needs continued PT services  PT Problem List Decreased strength;Decreased balance;Decreased mobility;Decreased knowledge of use of DME;Decreased knowledge of precautions       PT Treatment Interventions DME instruction;Gait training;Functional mobility training;Therapeutic activities;Therapeutic exercise;Balance training;Patient/family education    PT Goals (Current goals can be found in the Care Plan section)  Acute Rehab PT Goals Patient Stated Goal: to be able to walk more PT Goal Formulation: With patient Time For Goal Achievement: 10/31/17 Potential to Achieve Goals: Good    Frequency Min 2X/week   Barriers to discharge Decreased caregiver support      Co-evaluation               AM-PAC PT "6 Clicks" Daily Activity  Outcome Measure Difficulty turning over in bed (including adjusting bedclothes, sheets and blankets)?: None Difficulty moving from lying on back to sitting on the side of the bed? : None Difficulty sitting down on and standing up from a chair with arms (e.g., wheelchair, bedside commode, etc,.)?: A Little Help needed moving to and from a bed to chair (including a wheelchair)?: A Little Help needed walking in hospital room?: A Little Help needed climbing 3-5 steps with a railing? : A Little 6 Click Score: 20    End of Session Equipment Utilized During Treatment: Gait belt Activity Tolerance: Patient tolerated treatment well Patient left: in bed(clip alarm in place; B railings of stretcher up with B pads in  place; bed in lowest position) Nurse Communication: Mobility status;Precautions PT Visit Diagnosis: Other abnormalities of gait and mobility (R26.89);Muscle weakness (generalized) (M62.81)    Time: 1600-1630 PT Time Calculation (min) (ACUTE ONLY): 30 min   Charges:   PT Evaluation $PT Eval Low Complexity: 1 Low PT Treatments $Gait Training: 8-22 mins   PT G Codes:   PT G-Codes **NOT FOR INPATIENT CLASS** Functional Assessment Tool Used: AM-PAC 6 Clicks Basic Mobility Functional Limitation: Mobility: Walking and moving around Mobility: Walking and Moving Around Current Status ([id number]([id number]): At least 20 percent but less than 40 percent impaired, limited or restricted Mobility: Walking and Moving Around Goal Status [phone]([id number]): 0 percent impaired, limited or restricted    Hendricks Limesmily Zelene Barga, PT 10/17/17, 5:28 PM [phone]

## 2017-10-17 NOTE — ED Notes (Signed)
Pt given lunch tray,. Pt is sitting up eating at this time.

## 2017-10-17 NOTE — ED Notes (Signed)
Patient's son allowed to briefly visit with patient due to him just being discharged from the hospital.  Reinforced with family that visitation hours are 1200-1500, and that an exception was being made.  Understanding verbalized.

## 2017-10-17 NOTE — ED Notes (Signed)
Pt ambulated to bathroom with tech assistance. Pt returned to bed, given breakfast tray and sprite, made comfortable and ate everything on pt tray. Pt is happy and watching TV at this time and states she is comfortable and warm. Does not want a blanket. Covered with sheet at this time.

## 2017-10-17 NOTE — ED Notes (Signed)
ED Registration called to inform this RN that family is also requesting to see Social Work.  Social Work called and message left on Recruitment consultantvoice mail.

## 2017-10-17 NOTE — ED Notes (Signed)
Patient family currently in the waiting room asking to see DR. Clapacs and a copy of the patient bill of rights.  TTS notified of patient's family arrival and request.  TTS to notify Dr. Toni Amendlapacs.  Will update family once TTS has heard back from Dr. Toni Amendlapacs and an accurate timeline can be given to the family.  Noreene LarssonJill, ED Charge, notified of plan as well.

## 2017-10-17 NOTE — ED Notes (Signed)
VOL/Pending Placement 

## 2017-10-17 NOTE — ED Notes (Signed)
Return from EEG.  Awake and alert.  NAD

## 2017-10-17 NOTE — Clinical Social Work Note (Signed)
CSW received consult for "Nursing home placement." CSW met with pt's daughters at length in the family room alongside Charge RN Sharee Pimple, RN Opal Sidles, [place], [place]. Team addressed the family's concerns. CSW discussed placement options and difference in levels of care with daughters and they stated they would like to pursue [facility] placement in a memory care unit. CSW provided possible location and family agreeable to pursue [facility]. CSW discussed payor source need with family. Family to apply for long term care Medicaid in [place] (county of residence) and have not declined paying privately. CSW explained that if family cannot pay privately and facility does not allow pt to be admitted with Medicaid pending pt would need to discharge home. Family not happy with that option but expressed understanding. CSW staffed with Social Work Surveyor, quantity [employer].   CSW completed FL-2. Pt already has existing PASRR. CSW spoke with admissions coordinator Suezanne Cheshire [phone]) who reviewed FL-2 and is requesting a physical therapy evaluation to see if insurance will authorize short term rehab for pt. Ms. Erasmo Score also stated she would come to assess pt at [facility] in the late afternoon. CSW updated dtr-Brenda Nolon Rod in person. CSW updated Dr. Joni Fears and P/T eval ordered. Dr. Joni Fears stated neuro ordering an EEG.   CSW continuing to follow for transitions of care/discharge needs.   Oretha Ellis, Latanya Presser, [place] Social Worker-ED [phone]

## 2017-10-17 NOTE — Consult Note (Signed)
[facility] Consult   Reason for Consult: Follow-up for this 83 year old woman seen yesterday in the emergency room for dementia with agitation Referring Physician: Joni Fears Patient Identification: Susan Bradley MRN:  [id number] Principal Diagnosis: Dementia Diagnosis:   Patient Active Problem List   Diagnosis Date Noted  . Dementia [F03.90] 10/16/2017  . Near syncope [R55] 10/06/2017  . Generalized weakness [R53.1] 10/06/2017  . Bradycardia [R00.1] 10/06/2017  . Ambulatory dysfunction [R26.2] 10/06/2017  . CKD (chronic kidney disease) stage 3, GFR 30-59 ml/min (HCC) [N18.3]     Total Time spent with patient: 30 minutes  Subjective:   Susan Bradley is a 83 y.o. female patient admitted with "I do not know".  HPI: See note from yesterday.  This 83 year old woman with a known history of dementia was brought to the emergency room agitated.  When she first came in she required medication to sedate her for safety because of confusion and falls risk.  She was hydrated.  I saw her yesterday and spoke with her again today.  Patient was quite pleasant to talk with.  Awake and alert.  Knows that she is in the hospital but otherwise disoriented.  Does not know why she is in the hospital.  Could not tell me how old she was.  Does not know where she has recently been living.  Patient however denied suicidal or homicidal thoughts.  Denies hallucinations.  Did not appear to be responding to internal stimuli.  Has been calm and pleasant and cooperative all day today.  Patient was taken off of IVC yesterday and I had made the recommendation that she did not require further specific psychiatric treatment at that time.  Social history: Patient evidently living with her daughter.  It sounds like at least at times her behavior has become very difficult for them to safely manage at home.  Medical history: History of near syncope weakness was seen to be dehydrated when she came into the hospital as  shown by acute decline in kidney function  Past Psychiatric History: No past psychiatric history other than known dementia.  Risk to Self: Suicidal Ideation: No Suicidal Intent: No Is patient at risk for suicide?: No Suicidal Plan?: No Access to Means: No What has been your use of drugs/alcohol within the last 12 months?: Denied use How many times?: 0 Other Self Harm Risks: denied Triggers for Past Attempts: None known Intentional Self Injurious Behavior: None Risk to Others: Homicidal Ideation: No Thoughts of Harm to Others: No Current Homicidal Intent: No Current Homicidal Plan: No Access to Homicidal Means: No Identified Victim: None History of harm to others?: (aggressive today. -prior diagnosis of dementia) Assessment of Violence: On admission Does patient have access to weapons?: No Criminal Charges Pending?: No Does patient have a court date: No Prior Inpatient Therapy: Prior Inpatient Therapy: No Prior Therapy Dates: n/a Prior Therapy Facilty/Provider(s): n/a Reason for Treatment: n/a Prior Outpatient Therapy: Prior Outpatient Therapy: No Prior Therapy Dates: n/a Prior Therapy Facilty/Provider(s): n/a Reason for Treatment: n/a Does patient have an ACCT team?: No Does patient have Intensive In-House Services?  : No Does patient have [facility] services? : No Does patient have P4CC services?: No  Past Medical History:  Past Medical History:  Diagnosis Date  . CKD (chronic kidney disease) stage 3, GFR 30-59 ml/min (HCC)   . SAH (subarachnoid hemorrhage) ([place])    2000    Past Surgical History:  Procedure Laterality Date  . CHOLECYSTECTOMY     Family History: No family history  on file. Family Psychiatric  History: Patient has no known family history Social History:  Social History   Substance and Sexual Activity  Alcohol Use No     Social History   Substance and Sexual Activity  Drug Use No    Social History   Socioeconomic History  . Marital status:  Married    Spouse name: None  . Number of children: None  . Years of education: None  . Highest education level: None  Social Needs  . Financial resource strain: Not very hard  . Food insecurity - worry: Never true  . Food insecurity - inability: Never true  . Transportation needs - medical: No  . Transportation needs - non-medical: None  Occupational History  . None  Tobacco Use  . Smoking status: Former Smoker    Last attempt to quit: 08/06/2017    Years since quitting: 0.1  . Smokeless tobacco: Never Used  Substance and Sexual Activity  . Alcohol use: No  . Drug use: No  . Sexual activity: No  Other Topics Concern  . None  Social History Narrative  . None   Additional Social History:    Allergies:   Allergies  Allergen Reactions  . Haldol [Haloperidol Lactate] Other (See Comments)    agitation  . Seroquel [Quetiapine] Other (See Comments)    Makes pt's agitation worse.    Labs:  Results for orders placed or performed during the hospital encounter of 10/15/17 (from the past 48 hour(s))  Comprehensive metabolic panel     Status: Abnormal   Collection Time: 10/15/17  6:47 PM  Result Value Ref Range   Sodium 139 135 - 145 mmol/L   Potassium 3.9 3.5 - 5.1 mmol/L   Chloride 105 101 - 111 mmol/L   CO2 24 22 - 32 mmol/L   Glucose, Bld 111 (H) 65 - 99 mg/dL   BUN 51 (H) 6 - 20 mg/dL   Creatinine, Ser 1.50 (H) 0.44 - 1.00 mg/dL   Calcium 9.0 8.9 - 10.3 mg/dL   Total Protein 6.8 6.5 - 8.1 g/dL   Albumin 3.6 3.5 - 5.0 g/dL   AST 34 15 - 41 U/L   ALT 25 14 - 54 U/L   Alkaline Phosphatase 130 (H) 38 - 126 U/L   Total Bilirubin 0.8 0.3 - 1.2 mg/dL   GFR calc non Af Amer 31 (L) >60 mL/min   GFR calc Af Amer 36 (L) >60 mL/min    Comment: (NOTE) The eGFR has been calculated using the CKD EPI equation. This calculation has not been validated in all clinical situations. eGFR's persistently <60 mL/min signify possible Chronic Kidney Disease.    Anion gap 10 5 - 15  CBC      Status: Abnormal   Collection Time: 10/15/17  6:47 PM  Result Value Ref Range   WBC 7.1 3.6 - 11.0 K/uL   RBC 3.27 (L) 3.80 - 5.20 MIL/uL   Hemoglobin 10.5 (L) 12.0 - 16.0 g/dL   HCT 30.6 (L) 35.0 - 47.0 %   MCV 93.5 80.0 - 100.0 fL   MCH 32.1 26.0 - 34.0 pg   MCHC 34.4 32.0 - 36.0 g/dL   RDW 14.0 11.5 - 14.5 %   Platelets 258 150 - 440 K/uL  Urinalysis, Complete w Microscopic     Status: Abnormal   Collection Time: 10/15/17  7:26 PM  Result Value Ref Range   Color, Urine YELLOW (A) YELLOW   APPearance CLEAR (A) CLEAR  Specific Gravity, Urine 1.018 1.005 - 1.030   pH 5.0 5.0 - 8.0   Glucose, UA NEGATIVE NEGATIVE mg/dL   Hgb urine dipstick NEGATIVE NEGATIVE   Bilirubin Urine NEGATIVE NEGATIVE   Ketones, ur NEGATIVE NEGATIVE mg/dL   Protein, ur NEGATIVE NEGATIVE mg/dL   Nitrite NEGATIVE NEGATIVE   Leukocytes, UA NEGATIVE NEGATIVE   RBC / HPF 0-5 0 - 5 RBC/hpf   WBC, UA 0-5 0 - 5 WBC/hpf   Bacteria, UA NONE SEEN NONE SEEN   Squamous Epithelial / LPF 0-5 (A) NONE SEEN   Mucus PRESENT    Hyaline Casts, UA PRESENT   Basic metabolic panel     Status: Abnormal   Collection Time: 10/16/17 11:20 AM  Result Value Ref Range   Sodium 143 135 - 145 mmol/L   Potassium 3.8 3.5 - 5.1 mmol/L   Chloride 108 101 - 111 mmol/L   CO2 24 22 - 32 mmol/L   Glucose, Bld 95 65 - 99 mg/dL   BUN 35 (H) 6 - 20 mg/dL   Creatinine, Ser 0.96 0.44 - 1.00 mg/dL   Calcium 9.1 8.9 - 10.3 mg/dL   GFR calc non Af Amer 53 (L) >60 mL/min   GFR calc Af Amer >60 >60 mL/min    Comment: (NOTE) The eGFR has been calculated using the CKD EPI equation. This calculation has not been validated in all clinical situations. eGFR's persistently <60 mL/min signify possible Chronic Kidney Disease.    Anion gap 11 5 - 15    Current Facility-Administered Medications  Medication Dose Route Frequency Provider Last Rate Last Dose  . ALPRAZolam Duanne Moron) tablet 0.5 mg  0.5 mg Oral BID PRN Carrie Mew, MD      .  amLODipine (NORVASC) tablet 5 mg  5 mg Oral Daily Carrie Mew, MD   5 mg at 10/17/17 1036  . dorzolamide (TRUSOPT) 2 % ophthalmic solution 1 drop  1 drop Both Eyes BID Carrie Mew, MD      . famotidine (PEPCID) tablet 40 mg  40 mg Oral Daily Carrie Mew, MD   40 mg at 10/17/17 1036  . gabapentin (NEURONTIN) capsule 200 mg  200 mg Oral TID Earleen Newport, MD   200 mg at 10/17/17 1035  . hydrochlorothiazide (MICROZIDE) capsule 12.5 mg  12.5 mg Oral Daily Carrie Mew, MD   12.5 mg at 10/17/17 1036  . lisinopril (PRINIVIL,ZESTRIL) tablet 40 mg  40 mg Oral Daily Carrie Mew, MD   40 mg at 10/17/17 1034  . memantine (NAMENDA) tablet 10 mg  10 mg Oral BID Carrie Mew, MD   10 mg at 10/17/17 1036  . potassium chloride SA (K-DUR,KLOR-CON) CR tablet 10 mEq  10 mEq Oral Daily Carrie Mew, MD   10 mEq at 10/17/17 1036  . sertraline (ZOLOFT) tablet 200 mg  200 mg Oral Daily Carrie Mew, MD   200 mg at 10/17/17 1033  . timolol (BETIMOL) 0.5 % ophthalmic solution 1 drop  1 drop Both Eyes BID Carrie Mew, MD       Current Outpatient Medications  Medication Sig Dispense Refill  . ALPRAZolam (XANAX) 0.5 MG tablet Take 0.5 mg by mouth 2 (two) times daily as needed for sleep or anxiety.    Marland Kitchen amLODipine (NORVASC) 5 MG tablet Take 5 mg by mouth daily.    . dorzolamide (TRUSOPT) 2 % ophthalmic solution Place 1 drop into both eyes 2 (two) times daily.    Marland Kitchen gabapentin (NEURONTIN) 100 MG capsule Take  2 capsules (200 mg total) 3 (three) times daily by mouth. 180 capsule 5  . hydrochlorothiazide (MICROZIDE) 12.5 MG capsule Take 12.5 mg by mouth daily.     Marland Kitchen lisinopril (PRINIVIL,ZESTRIL) 40 MG tablet Take 1 tablet (40 mg total) by mouth daily. 30 tablet 0  . memantine (NAMENDA) 10 MG tablet Take 10 mg by mouth 2 (two) times daily.     . potassium chloride (K-DUR,KLOR-CON) 10 MEQ tablet Take 10 mEq by mouth daily.    . ranitidine (ZANTAC) 300 MG tablet Take 300 mg  by mouth daily.    . sertraline (ZOLOFT) 100 MG tablet Take 2 tablets (200 mg total) daily by mouth. 60 tablet 5  . timolol (BETIMOL) 0.5 % ophthalmic solution Place 1 drop into both eyes 2 (two) times daily.      Musculoskeletal: Strength & Muscle Tone: decreased and atrophy Gait & Station: unsteady Patient leans: N/A  Psychiatric Specialty Exam: Physical Exam  Nursing note and vitals reviewed. Constitutional: She appears well-developed and well-nourished.  HENT:  Head: Normocephalic and atraumatic.  Eyes: Conjunctivae are normal. Pupils are equal, round, and reactive to light.  Neck: Normal range of motion.  Cardiovascular: Regular rhythm and normal heart sounds.  Respiratory: Effort normal. No respiratory distress.  GI: Soft.  Musculoskeletal: Normal range of motion.  Neurological: She is alert.  Skin: Skin is warm and dry.  Psychiatric: She has a normal mood and affect. Her speech is delayed and tangential. She is slowed. She is not agitated, not aggressive, not hyperactive and not combative. Thought content is not paranoid. Cognition and memory are impaired. She expresses no homicidal and no suicidal ideation. She exhibits abnormal recent memory and abnormal remote memory.    Review of Systems  HENT: Negative.   Eyes: Negative.   Respiratory: Negative.   Cardiovascular: Negative.   Gastrointestinal: Negative.   Musculoskeletal: Positive for joint pain.  Skin: Negative.   Neurological: Positive for weakness.  Psychiatric/Behavioral: Positive for memory loss. Negative for depression, hallucinations, substance abuse and suicidal ideas. The patient is not nervous/anxious and does not have insomnia.     Blood pressure (!) 157/68, pulse 78, temperature 99.3 F (37.4 C), temperature source Oral, resp. rate 18, height 5' 4"  (1.626 m), weight 59.9 kg (132 lb), SpO2 96 %.Body mass index is 22.66 kg/m.  General Appearance: Casual  Eye Contact:  Fair  Speech:  Slow  Volume:   Decreased  Mood:  Euthymic  Affect:  Constricted  Thought Process:  Disorganized  Orientation:  Other:  Aware she is in a hospital but no idea about the date or situation  Thought Content:  Rumination  Suicidal Thoughts:  No  Homicidal Thoughts:  No  Memory:  Immediate;   Fair Recent;   Poor Remote;   Fair  Judgement:  Impaired  Insight:  Shallow  Psychomotor Activity:  Decreased  Concentration:  Concentration: Poor  Recall:  Poor  Fund of Knowledge:  Fair  Language:  Fair  Akathisia:  No  Handed:  Right  AIMS (if indicated):     Assets:  Financial Resources/Insurance Housing Resilience Social Support  ADL's:  Impaired  Cognition:  Impaired,  Moderate  Sleep:        Treatment Plan Summary: Plan 83 year old woman with a known history of dementia.  Agitated and delirious on presentation.  Most likely cause was acute dehydration.  Dehydration has been corrected and the patient is now calm not delirious and not showing any acute behavior problems.  Patient  primary need at this point is safe housing and care.  I had made my recommendation yesterday based on my understanding of the diagnosis.  I doubt that there would be any particular benefit from admission to a geriatric psychiatry unit or that one would be willing to take her under the current circumstances.  I understand that this degree of dementia is very difficult to care for her in a home environment.  Social work is involved and is looking into whether placement would be possible from the emergency room.  No need for any new medication or change to other specific treatment plan at this point.  Disposition: Patient does not meet criteria for psychiatric inpatient admission. Supportive therapy provided about ongoing stressors.  Alethia Berthold, MD 10/17/2017 4:31 PM

## 2017-10-17 NOTE — ED Notes (Signed)
Dr. Sherryll BurgerShah at bedside to evaluate patient, patient's daughter at bedside during the exam.  Dr. Sherryll BurgerShah collaborated with Dr. Scotty CourtStafford after exam.  EEG ordered.  Patient's daughter aware of immediate plan of care.  Social Work alerted to plan of care as well.

## 2017-10-17 NOTE — BH Assessment (Signed)
TTS met with family with Nursing staff to talk about pts care and future placement.

## 2017-10-18 LAB — BASIC METABOLIC PANEL
ANION GAP: 12 (ref 5–15)
BUN: 25 mg/dL — ABNORMAL HIGH (ref 6–20)
CALCIUM: 8.8 mg/dL — AB (ref 8.9–10.3)
CO2: 25 mmol/L (ref 22–32)
Chloride: 104 mmol/L (ref 101–111)
Creatinine, Ser: 1.05 mg/dL — ABNORMAL HIGH (ref 0.44–1.00)
GFR, EST AFRICAN AMERICAN: 55 mL/min — AB (ref >60)
GFR, EST NON AFRICAN AMERICAN: 48 mL/min — AB (ref >60)
Glucose, Bld: 153 mg/dL — ABNORMAL HIGH (ref 65–99)
Potassium: 4.2 mmol/L (ref 3.5–5.1)
SODIUM: 141 mmol/L (ref 135–145)

## 2017-10-18 MED ORDER — RISPERIDONE 0.5 MG PO TBDP
0.5000 mg | ORAL_TABLET | Freq: Every day | ORAL | Status: DC
  Administered 2017-10-18 – 2017-10-25 (×8): 0.5 mg via ORAL
  Filled 2017-10-18 (×9): qty 1

## 2017-10-18 MED ORDER — LORAZEPAM 1 MG PO TABS
ORAL_TABLET | ORAL | Status: AC
  Filled 2017-10-18: qty 1

## 2017-10-18 MED ORDER — LORAZEPAM 1 MG PO TABS
1.0000 mg | ORAL_TABLET | Freq: Once | ORAL | Status: AC
Start: 2017-10-18 — End: 2017-10-18
  Administered 2017-10-18: 1 mg via ORAL

## 2017-10-18 NOTE — ED Notes (Signed)
Patient wheeled around the ED in wheelchair by Mayra, EDT.

## 2017-10-18 NOTE — ED Notes (Signed)

## 2017-10-18 NOTE — ED Notes (Signed)
Patient refused shower will try again.

## 2017-10-18 NOTE — Clinical Social Work Note (Addendum)
[facility] [place] they have submitted clinical information to insurance company awaiting insurance authorization.  Patient will be going to memory care snf for rehab if insurance approves.  Ervin KnackEric R. Psalm Arman, MSW, Theresia MajorsLCSWA [phone]  10/18/2017 11:05 AM

## 2017-10-18 NOTE — ED Notes (Signed)
PT  VOL/  PENDING  PLACEMENT 

## 2017-10-18 NOTE — ED Provider Notes (Signed)
-----------------------------------------   6:18 AM on 10/18/2017 -----------------------------------------   Blood pressure (!) 156/75, pulse 85, temperature 99.6 F (37.6 C), resp. rate 18, height 5\' 4"  (1.626 m), weight 59.9 kg (132 lb), SpO2 96 %.  The patient had no acute events since last update.  Sleeping at this time.  Disposition is pending Psychiatry/Social Work team recommendations.     Irean HongSung, Theseus Birnie J, MD 10/18/17 [phone]

## 2017-10-18 NOTE — Clinical Social Work Note (Addendum)
CSW attempted to contact patient's daughter Jamesetta Sohyllis [phone] to update her on SNF placement, CSW left a message awaiting for call back from her.    3:30pm  CSW spoke to patient's daughter and updated her that SNF is waiting for insurance approval.  Patient's daughter expressed understanding.  CSW explained to her that if insurance does not approve patient, they will have to look at paying privately.  Patient's daughter stated that they have applied for Medicaid and it is still pending.  Patient's daughter expressed that patient's behaviors with wandering have become too much for them to handle, and patient's family does not feel comfortable having her return back home.  CSW will continue to follow patient's progress throughout discharge planning.    Ervin KnackEric R. Alaina Donati, MSW, Theresia MajorsLCSWA [phone]  10/18/2017 3:10 PM

## 2017-10-18 NOTE — ED Notes (Signed)
Pt attempting to get out of bed, pt redirected by ED staff. Side rails remain upx2 and bed alarm remains in place for safety. Pt pleasant and easily redirected.

## 2017-10-18 NOTE — ED Notes (Signed)
Patient wheeled around the ED in wheelchair by Mayra, EDT. 

## 2017-10-18 NOTE — ED Notes (Signed)
Patient becoming increasingly agitated and wanting to come out of her bed despite her being a fall risk.. MD informed.

## 2017-10-19 DIAGNOSIS — G301 Alzheimer's disease with late onset: Secondary | ICD-10-CM | POA: Diagnosis not present

## 2017-10-19 DIAGNOSIS — F0281 Dementia in other diseases classified elsewhere with behavioral disturbance: Secondary | ICD-10-CM | POA: Diagnosis not present

## 2017-10-19 LAB — BASIC METABOLIC PANEL
Anion gap: 8 (ref 5–15)
BUN: 29 mg/dL — ABNORMAL HIGH (ref 6–20)
CHLORIDE: 107 mmol/L (ref 101–111)
CO2: 26 mmol/L (ref 22–32)
Calcium: 9.1 mg/dL (ref 8.9–10.3)
Creatinine, Ser: 1.25 mg/dL — ABNORMAL HIGH (ref 0.44–1.00)
GFR calc non Af Amer: 39 mL/min — ABNORMAL LOW (ref >60)
GFR, EST AFRICAN AMERICAN: 45 mL/min — AB (ref >60)
Glucose, Bld: 150 mg/dL — ABNORMAL HIGH (ref 65–99)
POTASSIUM: 4.2 mmol/L (ref 3.5–5.1)
SODIUM: 141 mmol/L (ref 135–145)

## 2017-10-19 LAB — CBC
HEMATOCRIT: 31 % — AB (ref 35.0–47.0)
Hemoglobin: 10.7 g/dL — ABNORMAL LOW (ref 12.0–16.0)
MCH: 31.9 pg (ref 26.0–34.0)
MCHC: 34.5 g/dL (ref 32.0–36.0)
MCV: 92.4 fL (ref 80.0–100.0)
PLATELETS: 296 10*3/uL (ref 150–440)
RBC: 3.36 MIL/uL — AB (ref 3.80–5.20)
RDW: 13.9 % (ref 11.5–14.5)
WBC: 9 10*3/uL (ref 3.6–11.0)

## 2017-10-19 LAB — URINALYSIS, COMPLETE (UACMP) WITH MICROSCOPIC
BACTERIA UA: NONE SEEN
BILIRUBIN URINE: NEGATIVE
GLUCOSE, UA: NEGATIVE mg/dL
Hgb urine dipstick: NEGATIVE
KETONES UR: NEGATIVE mg/dL
LEUKOCYTES UA: NEGATIVE
Nitrite: NEGATIVE
PROTEIN: NEGATIVE mg/dL
SQUAMOUS EPITHELIAL / LPF: NONE SEEN
Specific Gravity, Urine: 1.011 (ref 1.005–1.030)
pH: 6 (ref 5.0–8.0)

## 2017-10-19 MED ORDER — LORAZEPAM 2 MG/ML IJ SOLN
INTRAMUSCULAR | Status: AC
  Administered 2017-10-19: 0.5 mg via INTRAVENOUS
  Filled 2017-10-19: qty 1

## 2017-10-19 MED ORDER — MEMANTINE HCL 10 MG PO TABS: 10.0000 mg | ORAL_TABLET | Freq: Two times a day (BID) | ORAL | 1 refills | Status: AC

## 2017-10-19 MED ORDER — TIMOLOL MALEATE 0.5 % OP SOLN: 1.0000 [drp] | Freq: Two times a day (BID) | OPHTHALMIC | 1 refills | Status: AC

## 2017-10-19 MED ORDER — POTASSIUM CHLORIDE CRYS ER 10 MEQ PO TBCR: 10.0000 meq | EXTENDED_RELEASE_TABLET | Freq: Every day | ORAL | 1 refills | Status: AC

## 2017-10-19 MED ORDER — SERTRALINE HCL 100 MG PO TABS: 200.0000 mg | ORAL_TABLET | Freq: Every day | ORAL | 0 refills | Status: AC

## 2017-10-19 MED ORDER — HYDROCHLOROTHIAZIDE 12.5 MG PO CAPS: 12.5000 mg | ORAL_CAPSULE | Freq: Every day | ORAL | 1 refills | Status: AC

## 2017-10-19 MED ORDER — LORAZEPAM 2 MG/ML IJ SOLN
0.5000 mg | Freq: Once | INTRAMUSCULAR | Status: AC
  Administered 2017-10-19: 0.5 mg via INTRAVENOUS

## 2017-10-19 MED ORDER — RISPERIDONE 0.5 MG PO TBDP: 0.5000 mg | ORAL_TABLET | Freq: Every day | ORAL | 1 refills | Status: AC

## 2017-10-19 MED ORDER — LISINOPRIL 40 MG PO TABS: 40.0000 mg | ORAL_TABLET | Freq: Every day | ORAL | 1 refills | Status: AC

## 2017-10-19 MED ORDER — DORZOLAMIDE HCL 2 % OP SOLN: 1.0000 [drp] | Freq: Two times a day (BID) | OPHTHALMIC | 1 refills | Status: AC

## 2017-10-19 MED ORDER — GABAPENTIN 100 MG PO CAPS: 200.0000 mg | ORAL_CAPSULE | Freq: Three times a day (TID) | ORAL | 1 refills | Status: AC

## 2017-10-19 MED ORDER — AMLODIPINE BESYLATE 5 MG PO TABS: 5.0000 mg | ORAL_TABLET | Freq: Every day | ORAL | 1 refills | Status: AC

## 2017-10-19 MED ORDER — SODIUM CHLORIDE 0.9 % IV SOLN
Freq: Once | INTRAVENOUS | Status: AC
  Administered 2017-10-19: 17:00:00 via INTRAVENOUS

## 2017-10-19 MED ORDER — FAMOTIDINE 40 MG PO TABS: 40.0000 mg | ORAL_TABLET | Freq: Every day | ORAL | 0 refills | Status: AC

## 2017-10-19 NOTE — ED Notes (Signed)
Pt continues to attempt to get out of bed, pt continues to throw her legs over guard rail and is calling out her "sisters" name "cheryl" pt redirected in bed by this tech

## 2017-10-19 NOTE — ED Notes (Signed)
Awaking patient offered breakfast patient refused, will try again

## 2017-10-19 NOTE — Progress Notes (Signed)
LCSW called Susan Bradley and she presented a plan that was presented to family for private pay. Called family and numbers were provided to both facility and daughter and communication was encouraged between them.  LCSW spoke to ED RN and printed off scripts and Dr Toni Amendlapacs report and [facility] and discussed possible d/c this afternoon.  [name] LCSW [phone].

## 2017-10-19 NOTE — ED Notes (Signed)
Room cleaned of trash, pt has hair comb and using it, NAD, calm

## 2017-10-19 NOTE — ED Notes (Signed)
Lab resulted reported to MD Don PerkingVeronese. Suggestion for IV hydration made and well received. Orders given.

## 2017-10-19 NOTE — Progress Notes (Signed)
LCSW [facility]. Still awaiting insurance authorization number   Delta Air LinesClaudine Leiann Sporer LCSW [phone]

## 2017-10-19 NOTE — Progress Notes (Signed)
LCSW received call from patients daughter [name] wanting to know why patient wont be going to facility until Monday. It was explained that facility wasn't able to accept her until Monday. LCSW did ask if other options were available and she reported the family is not able to provide support as her direct caregiver herself needs supervision at this time. Daughter insist's with even hired help for 48 hours at home  this is not a safe discharge and wants her Mom to remain in ED. Pt daughter very upset with facility and willing to pay privately   Daughter is agreeable to continue to look at other memory care SNF and asked to send out pt info. Daughter was [facility] called and they are not able to accept patient until Mon/Tues.   Wandra MannanZack Brooks called and spoke to [place] and it was explained that patient is on risperidone and needed to be observed in ed before they will accept patient to facility > LCSW  explained family situation. ChiropodistAssistant director is aware of complex family issues and LCSW will keep AD [facility] Brooks posted.  Delta Air LinesClaudine Maiya Kates LCSW [phone]

## 2017-10-19 NOTE — Progress Notes (Addendum)
[facility]  DON will not accept the patient until Monday and wants pt to remain in ED over weekend. ( Pt didn't eat her lunch and medications)  LCSW was asked to provide edp report, request an new psychiatrist report, then fax over medication scripts, all was complied with and then facility is requesting the patient remain in ED for the weekend or to be sent home until they can take her.  Consulted Wandra MannanZack Brooks to see if he can advocate for patient to facility Interior and spatial designerdirector.  Dr Toni Amendlapacs just reported that EDRN/Caroline was on the phone to ed psychiatrist reported, stated she did eat her lunch late today and has been on these medications for days and has responded well. Asked [place] to re address these issues with this director for her to change her decision. Director still refused patient  Arrie SenateClaudine Epiphany Seltzer LCSW [phone]

## 2017-10-19 NOTE — ED Notes (Signed)
Pt attempting to get out bed, redirected, and bedding straightened

## 2017-10-19 NOTE — ED Notes (Addendum)
Fall precautions in place, fall alarm and floor mats utilized. Patient within sight line of rover. Will continue to maintain throughout shift

## 2017-10-19 NOTE — ED Notes (Signed)
Pt walking around with EDT Misty StanleyLisa. Pt calm and cooperative

## 2017-10-19 NOTE — ED Notes (Signed)
Patient Sleeping. Easily arousable. Respirations equal and unlabored. Pedal pulses 3+ bilaterally. 

## 2017-10-19 NOTE — Consult Note (Signed)
[facility] Consult   Reason for Consult: Follow-up consult date of discharge 83 year old woman with dementia Referring Physician: Karma Greaser Patient Identification: Susan Bradley MRN:  [id number] Principal Diagnosis: Dementia Diagnosis:   Patient Active Problem List   Diagnosis Date Noted  . Dementia [F03.90] 10/16/2017  . Near syncope [R55] 10/06/2017  . Generalized weakness [R53.1] 10/06/2017  . Bradycardia [R00.1] 10/06/2017  . Ambulatory dysfunction [R26.2] 10/06/2017  . CKD (chronic kidney disease) stage 3, GFR 30-59 ml/min (HCC) [N18.3]     Total Time spent with patient: 30 minutes  Subjective:   Susan Bradley is a 83 y.o. female patient admitted with "I am fine".  HPI: 83 year old woman admitted to the emergency room because of dementia and alleged agitation at home.  Patient has been asymptomatic throughout her hospital stay.  She has some episodes mostly in the evening of getting slightly agitated and requiring redirection to stay in bed.  Medication has been added to help with this sundowning transition and she is now doing much better.  No other acute medical illnesses or problems going on.  Past Psychiatric History: No past psychiatric history  Risk to Self: Suicidal Ideation: No Suicidal Intent: No Is patient at risk for suicide?: No Suicidal Plan?: No Access to Means: No What has been your use of drugs/alcohol within the last 12 months?: Denied use How many times?: 0 Other Self Harm Risks: denied Triggers for Past Attempts: None known Intentional Self Injurious Behavior: None Risk to Others: Homicidal Ideation: No Thoughts of Harm to Others: No Current Homicidal Intent: No Current Homicidal Plan: No Access to Homicidal Means: No Identified Victim: None History of harm to others?: (aggressive today. -prior diagnosis of dementia) Assessment of Violence: On admission Does patient have access to weapons?: No Criminal Charges Pending?: No Does patient  have a court date: No Prior Inpatient Therapy: Prior Inpatient Therapy: No Prior Therapy Dates: n/a Prior Therapy Facilty/Provider(s): n/a Reason for Treatment: n/a Prior Outpatient Therapy: Prior Outpatient Therapy: No Prior Therapy Dates: n/a Prior Therapy Facilty/Provider(s): n/a Reason for Treatment: n/a Does patient have an ACCT team?: No Does patient have Intensive In-House Services?  : No Does patient have [facility] services? : No Does patient have P4CC services?: No  Past Medical History:  Past Medical History:  Diagnosis Date  . CKD (chronic kidney disease) stage 3, GFR 30-59 ml/min (HCC)   . SAH (subarachnoid hemorrhage) ([facility])    2000    Past Surgical History:  Procedure Laterality Date  . CHOLECYSTECTOMY     Family History: No family history on file. Family Psychiatric  History: None Social History:  Social History   Substance and Sexual Activity  Alcohol Use No     Social History   Substance and Sexual Activity  Drug Use No    Social History   Socioeconomic History  . Marital status: Married    Spouse name: None  . Number of children: None  . Years of education: None  . Highest education level: None  Social Needs  . Financial resource strain: Not very hard  . Food insecurity - worry: Never true  . Food insecurity - inability: Never true  . Transportation needs - medical: No  . Transportation needs - non-medical: None  Occupational History  . None  Tobacco Use  . Smoking status: Former Smoker    Last attempt to quit: 08/06/2017    Years since quitting: 0.2  . Smokeless tobacco: Never Used  Substance and Sexual Activity  . Alcohol use:  No  . Drug use: No  . Sexual activity: No  Other Topics Concern  . None  Social History Narrative  . None   Additional Social History:    Allergies:   Allergies  Allergen Reactions  . Haldol [Haloperidol Lactate] Other (See Comments)    agitation  . Seroquel [Quetiapine] Other (See Comments)    Makes  pt's agitation worse.    Labs:  Results for orders placed or performed during the hospital encounter of 10/15/17 (from the past 48 hour(s))  Basic metabolic panel     Status: Abnormal   Collection Time: 10/18/17  5:05 PM  Result Value Ref Range   Sodium 141 135 - 145 mmol/L   Potassium 4.2 3.5 - 5.1 mmol/L   Chloride 104 101 - 111 mmol/L   CO2 25 22 - 32 mmol/L   Glucose, Bld 153 (H) 65 - 99 mg/dL   BUN 25 (H) 6 - 20 mg/dL   Creatinine, Ser 1.05 (H) 0.44 - 1.00 mg/dL   Calcium 8.8 (L) 8.9 - 10.3 mg/dL   GFR calc non Af Amer 48 (L) >60 mL/min   GFR calc Af Amer 55 (L) >60 mL/min    Comment: (NOTE) The eGFR has been calculated using the CKD EPI equation. This calculation has not been validated in all clinical situations. eGFR's persistently <60 mL/min signify possible Chronic Kidney Disease.    Anion gap 12 5 - 15    Current Facility-Administered Medications  Medication Dose Route Frequency Provider Last Rate Last Dose  . amLODipine (NORVASC) tablet 5 mg  5 mg Oral Daily Carrie Mew, MD   5 mg at 10/19/17 0854  . dorzolamide (TRUSOPT) 2 % ophthalmic solution 1 drop  1 drop Both Eyes BID Carrie Mew, MD   1 drop at 10/19/17 0855  . famotidine (PEPCID) tablet 40 mg  40 mg Oral Daily Carrie Mew, MD   40 mg at 10/19/17 0853  . gabapentin (NEURONTIN) capsule 200 mg  200 mg Oral TID Earleen Newport, MD   200 mg at 10/19/17 0850  . hydrochlorothiazide (MICROZIDE) capsule 12.5 mg  12.5 mg Oral Daily Carrie Mew, MD   12.5 mg at 10/19/17 0853  . lisinopril (PRINIVIL,ZESTRIL) tablet 40 mg  40 mg Oral Daily Carrie Mew, MD   40 mg at 10/19/17 0851  . memantine (NAMENDA) tablet 10 mg  10 mg Oral BID Carrie Mew, MD   10 mg at 10/19/17 0854  . potassium chloride SA (K-DUR,KLOR-CON) CR tablet 10 mEq  10 mEq Oral Daily Carrie Mew, MD   10 mEq at 10/19/17 0854  . risperiDONE (RISPERDAL M-TABS) disintegrating tablet 0.5 mg  0.5 mg Oral QHS Aidenjames Heckmann,  Clotile Whittington T, MD   0.5 mg at 10/18/17 2131  . sertraline (ZOLOFT) tablet 200 mg  200 mg Oral Daily Carrie Mew, MD   200 mg at 10/19/17 0851  . timolol (TIMOPTIC) 0.5 % ophthalmic solution 1 drop  1 drop Both Eyes BID Carrie Mew, MD   1 drop at 10/19/17 0109   Current Outpatient Medications  Medication Sig Dispense Refill  . timolol (BETIMOL) 0.5 % ophthalmic solution Place 1 drop into both eyes 2 (two) times daily.    Derrill Memo ON 10/20/2017] amLODipine (NORVASC) 5 MG tablet Take 1 tablet (5 mg total) by mouth daily. 30 tablet 1  . dorzolamide (TRUSOPT) 2 % ophthalmic solution Place 1 drop into both eyes 2 (two) times daily. 10 mL 1  . [START ON 10/20/2017] famotidine (PEPCID)  40 MG tablet Take 1 tablet (40 mg total) by mouth daily. 30 tablet 0  . gabapentin (NEURONTIN) 100 MG capsule Take 2 capsules (200 mg total) by mouth 3 (three) times daily. 180 capsule 1  . [START ON 10/20/2017] hydrochlorothiazide (MICROZIDE) 12.5 MG capsule Take 1 capsule (12.5 mg total) by mouth daily. 30 capsule 1  . [START ON 10/20/2017] lisinopril (PRINIVIL,ZESTRIL) 40 MG tablet Take 1 tablet (40 mg total) by mouth daily. 30 tablet 1  . memantine (NAMENDA) 10 MG tablet Take 1 tablet (10 mg total) by mouth 2 (two) times daily. 60 tablet 1  . [START ON 10/20/2017] potassium chloride SA (K-DUR,KLOR-CON) 10 MEQ tablet Take 1 tablet (10 mEq total) by mouth daily. 30 tablet 1  . risperiDONE (RISPERDAL M-TABS) 0.5 MG disintegrating tablet Take 1 tablet (0.5 mg total) by mouth at bedtime. 30 tablet 1  . [START ON 10/20/2017] sertraline (ZOLOFT) 100 MG tablet Take 2 tablets (200 mg total) by mouth daily. 60 tablet 0  . timolol (TIMOPTIC) 0.5 % ophthalmic solution Place 1 drop into both eyes 2 (two) times daily. 10 mL 1    Musculoskeletal: Strength & Muscle Tone: decreased Gait & Station: unsteady Patient leans: N/A  Psychiatric Specialty Exam: Physical Exam  Nursing note and vitals reviewed. Constitutional: She  appears well-developed and well-nourished.  HENT:  Head: Normocephalic and atraumatic.  Eyes: Conjunctivae are normal. Pupils are equal, round, and reactive to light.  Neck: Normal range of motion.  Cardiovascular: Regular rhythm and normal heart sounds.  Respiratory: Effort normal. No respiratory distress.  GI: Soft.  Musculoskeletal: Normal range of motion.  Neurological: She is alert.  Skin: Skin is warm and dry.  Psychiatric: She has a normal mood and affect. Her behavior is normal. Judgment and thought content normal.    Review of Systems  Constitutional: Negative.   HENT: Negative.   Eyes: Negative.   Respiratory: Negative.   Cardiovascular: Negative.   Gastrointestinal: Negative.   Musculoskeletal: Negative.   Skin: Negative.   Neurological: Negative.   Psychiatric/Behavioral: Negative.     Blood pressure (!) 146/74, pulse 72, temperature 98.8 F (37.1 C), temperature source Oral, resp. rate 18, height 5' 4"  (1.626 m), weight 59.9 kg (132 lb), SpO2 100 %.Body mass index is 22.66 kg/m.  General Appearance: Casual  Eye Contact:  Good  Speech:  Clear and Coherent  Volume:  Normal  Mood:  Euthymic  Affect:  Congruent  Thought Process:  Goal Directed  Orientation:  Full (Time, Place, and Person)  Thought Content:  Logical  Suicidal Thoughts:  No  Homicidal Thoughts:  No  Memory:  Immediate;   Good  Judgement:  Fair  Insight:  Fair  Psychomotor Activity:  Normal  Concentration:  Concentration: Fair  Recall:  [place] of Knowledge:  Fair  Language:  Fair  Akathisia:  No  Handed:  Right  AIMS (if indicated):     Assets:  Social Support  ADL's:  Impaired  Cognition:  Impaired,  Mild  Sleep:        Treatment Plan Summary: Plan 83 year old woman with dementia he is calm cooperative asymptomatic no behavior problems and is ready for discharge to a memory unit.  Disposition: Patient does not meet criteria for psychiatric inpatient admission. Supportive therapy  provided about ongoing stressors.  Alethia Berthold, MD 10/19/2017 1:59 PM

## 2017-10-19 NOTE — Progress Notes (Signed)
[facility] and is awaiting a call back. Consulted EDP and explained we are awaiting insurance authorization number for this patient.  They responded back and will see if authorization is completed, facility was saying they may need chest x-ray to rule out TB and new PT evaluation will see with edp to see if these are necessary.  In review of last SW notes family is aware of this memory care option and agreeable to this plan.  CSW will continue to follow   Delta Air LinesClaudine Farrell Pantaleo LCSW [phone]

## 2017-10-19 NOTE — ED Notes (Signed)
Patient has become acutely disoriented above baseline, swatting and occasionally hitting staff. Attempted redirection, reduced stimulation, and therapeutic rapport with patient prior to requesting PRN medication for agitation

## 2017-10-19 NOTE — ED Notes (Signed)
EDT Allayne StackJann at pt bedside for safety reasons

## 2017-10-19 NOTE — ED Notes (Addendum)
Pt redirected back in the bed by this tech

## 2017-10-19 NOTE — ED Provider Notes (Signed)
-----------------------------------------   7:27 AM on 10/19/2017 -----------------------------------------   Blood pressure (!) 146/74, pulse 72, temperature 98.8 F (37.1 C), temperature source Oral, resp. rate 18, height 1.626 m (5\' 4" ), weight 59.9 kg (132 lb), SpO2 100 %.  The patient had no acute events since last update.  Calm and cooperative at this time.  Social work consult is pending.    Loleta RoseForbach, Trevor Duty, MD 10/19/17 [phone]

## 2017-10-19 NOTE — ED Notes (Signed)
Patient has been sleeping an unusual amount today however has been easily arousable and answers questions to baseline once wakened. RN

## 2017-10-19 NOTE — ED Notes (Addendum)
Pt given a wash clothes to fold to occupy her time at this time

## 2017-10-19 NOTE — Progress Notes (Signed)
Physical Therapy Treatment Patient Details Name: Susan Bradley MRN: [id number] DOB: [birth year] Today's Date: 10/19/2017    History of Present Illness Pt is an 83 y.o. female presenting to ED with AMS/increased aggressive behavior with behavioral disturbances.  PMH includes CKD, SAH, dementia, near syncope, bradycardia.    PT Comments    Pt appearing "sleepy" during today's session (staff reporting pt did not sleep well yesterday and was sleeping a lot today).  D/t pt difficulties consistently following commands safely, deferred ambulation.  Pt tolerated LE ex's well but requiring significant cueing for technique (pt often quick with movement and switching from one exercise to another without finishing current exercise).  Posterior lean noted with standing requiring assist for balance.  Will continue to progress pt with strengthening, balance, and progressive functional mobility.     Follow Up Recommendations  SNF     Equipment Recommendations  Rolling walker with 5" wheels    Recommendations for Other Services       Precautions / Restrictions Precautions Precautions: Fall Restrictions Weight Bearing Restrictions: No    Mobility  Bed Mobility Overal bed mobility: Independent             General bed mobility comments: Supine to/from sit without any difficulties.  Transfers Overall transfer level: Needs assistance Equipment used: None Transfers: Sit to/from Stand Sit to Stand: Min guard;Min assist         General transfer comment: 1st trial standing pt CGA (increased effort to perform); x10 sit to/from stand trials pt min assist d/t posterior lean standing  Ambulation/Gait Ambulation/Gait assistance: Min assist;Mod assist   Assistive device: 1 person hand held assist       General Gait Details: pt unsteady marching in place x10 reps requiring assist to steady; pt appearing tired and not following directions consistently so deferred ambulation d/t safety  concerns   Stairs            Wheelchair Mobility    Modified Rankin (Stroke Patients Only)       Balance Overall balance assessment: Needs assistance Sitting-balance support: No upper extremity supported;Feet supported Sitting balance-Leahy Scale: Fair Sitting balance - Comments: static sitting (pt laying back on bed to rest if not cued to sit upright)   Standing balance support: Bilateral upper extremity supported Standing balance-Leahy Scale: Poor Standing balance comment: requires assist to steady marching in place                            Cognition Arousal/Alertness: (Pt appearing sleepy during session (quick to close her eyes when resting but opens with vc's and activity)) Behavior During Therapy: Impulsive Overall Cognitive Status: No family/caregiver present to determine baseline cognitive functioning(Oriented to self and place)                                        Exercises General Exercises - Lower Extremity Ankle Circles/Pumps: AROM;Strengthening;Both;10 reps;Supine Long Arc Quad: AROM;Strengthening;Both;10 reps;Seated Heel Slides: AROM;Strengthening;Both;10 reps;Supine Hip ABduction/ADduction: AROM;Strengthening;Both;10 reps;Supine Straight Leg Raises: AROM;Strengthening;Both;10 reps;Supine Hip Flexion/Marching: AROM;Strengthening;Both;10 reps;Seated    General Comments General comments (skin integrity, edema, etc.): Pt sleeping in stretcher bed upon PT entry.  Nursing cleared pt for participation in physical therapy.  Pt agreeable to PT session.      Pertinent Vitals/Pain Pain Assessment: No/denies pain    Home Living  Prior Function            PT Goals (current goals can now be found in the care plan section) Acute Rehab PT Goals Patient Stated Goal: to get stronger PT Goal Formulation: With patient Time For Goal Achievement: 10/31/17 Potential to Achieve Goals: Good Additional  Goals Additional Goal #1: Perform objective balance assessment. Progress towards PT goals: Progressing toward goals    Frequency    Min 2X/week      PT Plan Current plan remains appropriate    Co-evaluation              AM-PAC PT "6 Clicks" Daily Activity  Outcome Measure  Difficulty turning over in bed (including adjusting bedclothes, sheets and blankets)?: None Difficulty moving from lying on back to sitting on the side of the bed? : A Little Difficulty sitting down on and standing up from a chair with arms (e.g., wheelchair, bedside commode, etc,.)?: Unable Help needed moving to and from a bed to chair (including a wheelchair)?: A Little Help needed walking in hospital room?: A Lot Help needed climbing 3-5 steps with a railing? : A Lot 6 Click Score: 15    End of Session Equipment Utilized During Treatment: Gait belt Activity Tolerance: Patient tolerated treatment well Patient left: in bed(both stretcher rails up with B pads in place; clip alarm in place; bed in lowest position) Nurse Communication: Mobility status;Precautions PT Visit Diagnosis: Other abnormalities of gait and mobility (R26.89);Muscle weakness (generalized) (M62.81)     Time: [id number] PT Time Calculation (min) (ACUTE ONLY): 23 min  Charges:  $Therapeutic Exercise: 8-22 mins $Therapeutic Activity: 8-22 mins                    G CodesHendricks Limes:      Susan Bradley, PT 10/19/17, 3:47 PM [phone]

## 2017-10-19 NOTE — Progress Notes (Signed)
LCSW  Received call from  Baird Cancerrene Fenelli  [phone]( Patients and family friend) who is going to be acting as a mediator for the family. LCSW explained that ED Staff will only provide information to [facility] unless in writting from [facility] as this is a direct HIPPA violation. ( What was explained was the hospital d/c process and what happens to discharged patients who are not picked up by family members)  No information about patient was provided and this worker has consulted with Psychiatrist who will leave information and scripts for patient. Fl2 was signed by Scotty CourtStafford.  Dr Toni Amendlapacs will leave scripts on her chart for d/c  Called [place] CitizenCaroline at [facility] and texted message to see about authorization. Awaiting response.  Delta Air LinesClaudine Odessa Morren LCSW [phone]

## 2017-10-19 NOTE — ED Notes (Addendum)
Rn Danelle Earthly(Noel) notified of pt becoming increasingly agitated as well as her continuing to get out of bed

## 2017-10-19 NOTE — ED Provider Notes (Signed)
-----------------------------------------   8:42 AM on 10/19/2017 -----------------------------------------   Blood pressure (!) 146/74, pulse 72, temperature 98.8 F (37.1 C), temperature source Oral, resp. rate 18, height 5\' 4"  (1.626 m), weight 59.9 kg (132 lb), SpO2 100 %.  [facility] recent [facility] to rule out TB. CXR done 10/15/17 and negative for TB.   Nita SickleVeronese, Weldon Spring, MD 10/19/17 [phone]

## 2017-10-19 NOTE — ED Notes (Addendum)
Pt given warm blanket and tv channel changed to icare channel, pt is attempting to get out of bed, pt repositioned in bed by this tech RN Danelle EarthlyNoel notified of pt agitated state

## 2017-10-19 NOTE — ED Notes (Signed)
Pt eating orange, appears pleasant and smiling

## 2017-10-19 NOTE — ED Notes (Signed)
Pt repositioned back in bed, given pillow again as well as another warm blanket, pt resting quietly at this time

## 2017-10-19 NOTE — ED Notes (Signed)
Offered meal again patient still refused and went back to sleep.

## 2017-10-20 MED ORDER — DIPHENHYDRAMINE HCL 25 MG PO CAPS
25.0000 mg | ORAL_CAPSULE | Freq: Once | ORAL | Status: AC
  Administered 2017-10-20: 25 mg via ORAL
  Filled 2017-10-20: qty 1

## 2017-10-20 MED ORDER — SODIUM CHLORIDE 0.9 % IV BOLUS (SEPSIS)
500.0000 mL | Freq: Once | INTRAVENOUS | Status: AC
  Administered 2017-10-20: 500 mL via INTRAVENOUS

## 2017-10-20 NOTE — ED Notes (Signed)
BEHAVIORAL HEALTH ROUNDING  Patient sleeping: No.  Patient alert and oriented: yes  Behavior appropriate: Yes. ; If no, describe:  Nutrition and fluids offered: Yes  Toileting and hygiene offered: Yes  Sitter present: not applicable, Q 15 min safety rounds and observation.  Law enforcement present: Yes ODS  

## 2017-10-20 NOTE — ED Notes (Signed)
Patient still resting comfortably on bed asleep aroused easily adjusted blankets pt snuggled back down and stated that feels good; Dinner tray not given due to patient sleeping

## 2017-10-20 NOTE — ED Notes (Signed)
Pt eating

## 2017-10-20 NOTE — ED Notes (Addendum)
ENVIRONMENTAL ASSESSMENT  Potentially harmful objects out of patient reach: Yes.  Personal belongings secured: Yes.  Patient dressed in hospital provided attire only: Yes.  Plastic bags out of patient reach: Yes.  Patient care equipment (cords, cables, call bells, lines, and drains) shortened, removed, or accounted for: Yes.  Equipment and supplies removed from bottom of stretcher: Yes.  Potentially toxic materials out of patient reach: Yes.  Sharps container removed or out of patient reach: Yes.   BEHAVIORAL HEALTH ROUNDING  Patient sleeping: No.  Patient alert and oriented: yes  Behavior appropriate: Yes. ; If no, describe:  Nutrition and fluids offered: Yes  Toileting and hygiene offered: Yes  Sitter present: not applicable, Q 15 min safety rounds and observation.  Law enforcement present: Yes ODS  ED BHU PLACEMENT JUSTIFICATION  Is the patient under IVC or is there intent for IVC: no.  Is the patient medically cleared: Yes.  Is there vacancy in the ED BHU: Yes.  Is the population mix appropriate for patient: no.  Is the patient awaiting placement in inpatient or outpatient setting: Yes.  Has the patient had a psychiatric consult: Yes.  Survey of unit performed for contraband, proper placement and condition of furniture, tampering with fixtures in bathroom, shower, and each patient room: Yes. ; Findings: All clear  APPEARANCE/BEHAVIOR  calm, cooperative and adequate rapport can be established  NEURO ASSESSMENT  Orientation: self per her baseline Hallucinations: No.None noted (Hallucinations)  Speech: Normal  Gait: unsteady  RESPIRATORY ASSESSMENT  WNL  CARDIOVASCULAR ASSESSMENT  WNL  GASTROINTESTINAL ASSESSMENT  WNL  EXTREMITIES  WNL  PLAN OF CARE  Provide calm/safe environment. Vital signs assessed TID. ED BHU Assessment once each 12-hour shift. Collaborate with TTS daily or as condition indicates. Assure the ED provider has rounded once each shift. Provide and  encourage hygiene. Provide redirection as needed. Assess for escalating behavior; address immediately and inform ED provider.  Assess family dynamic and appropriateness for visitation as needed: Yes. ; If necessary, describe findings:  Educate the patient/family about BHU procedures/visitation: Yes. ; If necessary, describe findings: Pt is calm and cooperative at this time. Will continue to monitor with Q 15 min safety rounds and observation.

## 2017-10-20 NOTE — ED Notes (Signed)
Patient is voluntary and is pending placement. 

## 2017-10-20 NOTE — ED Notes (Signed)
Visitor at bedside.

## 2017-10-20 NOTE — ED Notes (Signed)
Pt being wheeled around department by sitter. Pt now less agitated.

## 2017-10-20 NOTE — ED Notes (Signed)
Pt assisted to bathroom. Linens changed on bed. Pt is awake, positioned upright in bed, pt eating dinner. Pt pleasant and cooperative. No issues

## 2017-10-20 NOTE — ED Notes (Signed)
Pt calm and cooperative at this time.

## 2017-10-20 NOTE — ED Notes (Signed)
Pt resting at this time. Respirations even and unlabored.

## 2017-10-20 NOTE — ED Notes (Signed)
Pt resting comfortably at this time.

## 2017-10-20 NOTE — ED Notes (Signed)
Pt easily arousable, still resting at this time

## 2017-10-20 NOTE — ED Notes (Signed)
Assumed care of pt from NT Lake[place]Jacob; Rounds made patient asleep;hands within view
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
## 2017-10-20 NOTE — ED Notes (Signed)
PT VOLUNTARY PENDING PLACEMENT. 

## 2017-10-20 NOTE — Progress Notes (Signed)
LCSW was asked by EDRN to meet with family member of patient. LCSW concurred. Patient was asleep and resting comfortable. The family member ( password provided) came to follow up on patient. During our discussion LCSW disclosed efforts made to locate additional locked memory facilities that may be suitable to patient and had no success.  It was disclosed to this worker that the patients daughter is currently at Urgent care herself as she is struggling with multiple health issues and is not in a position to take back her mother until facility will take patient. Family was made aware of all hospital policies once patient is discharged.   ED RN and EDP were updated.   Delta Air LinesClaudine Jaqualyn Juday LCSW [phone]

## 2017-10-20 NOTE — ED Provider Notes (Signed)
-----------------------------------------   7:13 AM on 10/20/2017 -----------------------------------------   Blood pressure (!) 164/73, pulse 93, temperature 99 F (37.2 C), temperature source Oral, resp. rate 18, height 5\' 4"  (1.626 m), weight 59.9 kg (132 lb), SpO2 97 %.  The patient had no acute events since last update.  Calm and cooperative at this time.  Disposition is pending social work recommendations.  Patient did receive a dose of Benadryl last night to help her sleep.     Rebecka ApleyWebster, Baileigh Modisette P, MD 10/20/17 [phone]

## 2017-10-20 NOTE — ED Notes (Signed)
Patient asleep at this time; report given to oncoming NT  

## 2017-10-20 NOTE — ED Notes (Signed)
Pt has become increasingly agitated,pt no longer responds appropriately to questions, appears to be interacting with unseen stimuli (reaching at air), safety sitter mover to bedside, [place], [place]  [place] alarm attached, pt denies need for toileting  Dr Zenda AlpersWebster and Dr Derrill KayGoodman notified, orders recieved

## 2017-10-20 NOTE — Progress Notes (Signed)
LCSW consulted with Director of Social work and explained recent [facility] [place]. [place] Director of Social Work called facility requesting a call back from Production designer, theatre/television/filmAdministrator and DON. She is awaiting a call back.  Delta Air LinesClaudine Cataleyah Colborn LCSW [phone]

## 2017-10-20 NOTE — ED Notes (Signed)
Patient given dinner tray ate all of her meal able to feed her self with little assistance from NT handing her the items on tray; Sitting in bed comfortably

## 2017-10-21 MED ORDER — SODIUM CHLORIDE 0.9 % IV BOLUS (SEPSIS)
1000.0000 mL | Freq: Once | INTRAVENOUS | Status: AC
  Administered 2017-10-21: 1000 mL via INTRAVENOUS

## 2017-10-21 NOTE — ED Notes (Signed)
MD notified of BP and patient presentation. See orders.

## 2017-10-21 NOTE — ED Notes (Signed)
Gave patient a warm blanket.

## 2017-10-21 NOTE — ED Notes (Signed)
Patient agitated, attempting to get out of bed. Easy to redirect. Patient given stack of washcloths to hold to keep patient occupied.

## 2017-10-21 NOTE — ED Notes (Signed)
BEHAVIORAL HEALTH ROUNDING Patient sleeping: Yes.   Patient alert and oriented: not applicable SLEEPING Behavior appropriate: Yes.  ; If no, describe: SLEEPING Nutrition and fluids offered: No SLEEPING Toileting and hygiene offered: NoSLEEPING Sitter present: not applicable, Q 15 min safety rounds and observation. Law enforcement present: Yes ODS 

## 2017-10-21 NOTE — ED Notes (Signed)
Patient sitting up in bed with eyes closed. Even and non labored respirations noted. IVF infusing.

## 2017-10-21 NOTE — ED Notes (Signed)
Patient is very lethargic. GCS 14. Patient will arouse to verbal stimuli but will quickly fall back asleep. Even and non labored respirations noted. Patient has been sleeping the majority of the day. Patient denies pain. Assisted to bedside commode x2 staff assist.

## 2017-10-21 NOTE — ED Notes (Signed)
Dr. Lamont Snowballifenbark notified of BP. No new orders.

## 2017-10-21 NOTE — ED Notes (Signed)
Housekeeping at bedside to clean room.

## 2017-10-21 NOTE — ED Notes (Addendum)
Patient agitated. Patient attempting to get out of bed. Incomprehensible speech noted. Patient requires multiple repeatative cues in order to follow commands.

## 2017-10-21 NOTE — ED Notes (Signed)
BEHAVIORAL HEALTH ROUNDING  Patient sleeping: No.  Patient alert and oriented: yes  Behavior appropriate: Yes. ; If no, describe:  Nutrition and fluids offered: Yes  Toileting and hygiene offered: Yes  Sitter present: not applicable, Q 15 min safety rounds and observation.  Law enforcement present: Yes ODS  

## 2017-10-21 NOTE — ED Provider Notes (Signed)
-----------------------------------------   10:26 PM on 10/21/2017 -----------------------------------------  Patient had a fall 1-2 hours earlier in which she stumbled backwards into the metal gate that is currently pulled down covering the medical equipment.  I have reevaluated the patient at this time she continues to state no pain.  Did not believe she hit her head.  Has no evidence of head trauma on exam.  I have ordered a one-to-one sitter for the patient as a safety precaution.  Patient is extremely unstable when attempting to ambulate.   Minna AntisPaduchowski, [name], MD 10/21/17 [phone]

## 2017-10-21 NOTE — ED Notes (Addendum)
BEHAVIORAL HEALTH ROUNDING  Patient sleeping: No.  Patient alert and oriented: yes  Behavior appropriate: Yes. ; If no, describe:  Nutrition and fluids offered: Yes  Toileting and hygiene offered: Yes  Sitter present:1 to 1 safety sitter , Q 15 min safety rounds and observation.  Law enforcement present: Yes ODS

## 2017-10-21 NOTE — ED Provider Notes (Signed)
-----------------------------------------   6:45 AM on 10/21/2017 -----------------------------------------   Blood pressure (!) 136/56, pulse 92, temperature 99 F (37.2 C), temperature source Oral, resp. rate 16, height 5\' 4"  (1.626 m), weight 59.9 kg (132 lb), SpO2 98 %.  The patient had no acute events since last update.  Calm and cooperative at this time.  Disposition is pending Psychiatry/Behavioral Medicine team recommendations.     Merrily Brittleifenbark, Talik Casique, MD 10/21/17 [phone]

## 2017-10-21 NOTE — ED Notes (Addendum)
BEHAVIORAL HEALTH ROUNDING  Patient sleeping: No.  Patient alert and oriented: yes  Behavior appropriate: Yes. ; If no, describe:  Nutrition and fluids offered: Yes  Toileting and hygiene offered: Yes  Sitter present:1 to 1 safety sitter , Q 15 min safety rounds and observation.  Law enforcement present: Yes ODS  

## 2017-10-21 NOTE — ED Notes (Signed)
Patient is eating the contents of her breakfast tray at this time.  

## 2017-10-21 NOTE — ED Notes (Signed)
ENVIRONMENTAL ASSESSMENT  Potentially harmful objects out of patient reach: Yes.  Personal belongings secured: Yes.  Patient dressed in hospital provided attire only: Yes.  Plastic bags out of patient reach: Yes.  Patient care equipment (cords, cables, call bells, lines, and drains) shortened, removed, or accounted for: Yes.  Equipment and supplies removed from bottom of stretcher: Yes.  Potentially toxic materials out of patient reach: Yes.  Sharps container removed or out of patient reach: Yes.   BEHAVIORAL HEALTH ROUNDING  Patient sleeping: No.  Patient alert and oriented: to self only which is her baseline Behavior appropriate: Yes. ; If no, describe:  Nutrition and fluids offered: Yes  Toileting and hygiene offered: Yes  Sitter present: not applicable, Q 15 min safety rounds and observation.  Law enforcement present: Yes ODS  APPEARANCE/BEHAVIOR  Restless, requires frequent redirection  NEURO ASSESSMENT  Orientation: self only which is her baseling Hallucinations: No.None noted (Hallucinations)  Speech: Normal  Gait: unsteady RESPIRATORY ASSESSMENT  WNL  CARDIOVASCULAR ASSESSMENT  WNL  GASTROINTESTINAL ASSESSMENT  WNL  EXTREMITIES  ROM of all joints is normal  PLAN OF CARE  Provide calm/safe environment. Vital signs assessed twice daily. ED BHU Assessment once each 12-hour shift. Collaborate with intake RN daily or as condition indicates. Assure the ED provider has rounded once each shift. Provide and encourage hygiene. Provide redirection as needed. Assess for escalating behavior; address immediately and inform ED provider.  Assess family dynamic and appropriateness for visitation as needed: Yes. ; If necessary, describe findings:  Educate the patient/family about BHU procedures/visitation: Yes. ; If necessary, describe findings:

## 2017-10-21 NOTE — ED Notes (Signed)
Patient sitting up in bed folding washcloths. Even and non labored respirations noted.

## 2017-10-21 NOTE — ED Notes (Signed)
Gerilyn PilgrimJacob, EDT wheeling patient around unit in wheelchair in attempts to help with agitation.

## 2017-10-22 NOTE — ED Notes (Signed)
Pt sitting up in bed eating breakfast. Pt is calm, cooperative, pleasant.

## 2017-10-22 NOTE — ED Notes (Addendum)
Pt's daughter and POA Hilda LiasBrenda Stallings called per her request. She states that the other daughter, Jamesetta Sohyllis, is "under the weather." She asked for and received report (after providing password)on pt's status today. She asked which social worker is on today, to which this nurse explained that we don't always know. She asked for phone number of social worker, and was told that I could not give that to her. She stated that they might have it; left it at that. She apparently wants an update. This nurse called social work, and was told that they are shorthanded and that they are handling emergencies today.   Also informed daughter about pt's fall and the resulting examination by EDP and placement of safety sitter.

## 2017-10-22 NOTE — ED Notes (Addendum)
BEHAVIORAL HEALTH ROUNDING Patient sleeping: Yes.   Patient alert and oriented: not applicable SLEEPING Behavior appropriate: Yes.  ; If no, describe: SLEEPING Nutrition and fluids offered: No SLEEPING Toileting and hygiene offered: NoSLEEPING Sitter present:1 to 1 safety sitter , Q 15 min safety rounds and observation. Law enforcement present: Yes ODS 

## 2017-10-22 NOTE — ED Notes (Signed)
Gave patient a food tray 

## 2017-10-22 NOTE — ED Notes (Addendum)
SET PATIENT MEAL TRAY  UP.

## 2017-10-22 NOTE — ED Notes (Signed)
BEHAVIORAL HEALTH ROUNDING  Patient sleeping: No.  Patient alert and oriented: yes  Behavior appropriate: Yes. ; If no, describe:  Nutrition and fluids offered: Yes  Toileting and hygiene offered: Yes  Sitter present:1 to 1 safety sitter , Q 15 min safety rounds and observation.  Law enforcement present: Yes ODS  

## 2017-10-22 NOTE — Consult Note (Signed)
  Psychiatry: Follow-up 83 year old woman with dementia.  No new complaints.  Behavior mostly calm withdrawn.  No change to clinical presentation.  PT is working with the patient.  Family continues to insist on the need for placement.  We will continue to follow up with social work in the emergency room.

## 2017-10-22 NOTE — Progress Notes (Signed)
Physical Therapy Treatment Patient Details Name: Susan CivatteJoyce Jewell MRN: [id number] DOB: 1932-10-16 Today's Date: 10/22/2017    History of Present Illness Pt is an 83 y.o. female presenting to ED with AMS/increased aggressive behavior with behavioral disturbances.  PMH includes CKD, SAH, dementia, near syncope, bradycardia.    PT Comments    Pt demonstrating difficulty with coordinating movement/sequencing with functional mobility and requiring tactile cues, verbal cues, and visual demonstration to assist.  Pt appearing unsteady with ambulation so trialed RW again but pt had difficulty with RW use again and did better with hand held assist with ambulation.  Will continue to progress pt with strengthening, balance, and progressive functional mobility per pt tolerance.    Follow Up Recommendations  SNF     Equipment Recommendations  Rolling walker with 5" wheels    Recommendations for Other Services       Precautions / Restrictions Precautions Precautions: Fall Restrictions Weight Bearing Restrictions: No    Mobility  Bed Mobility Overal bed mobility: Needs Assistance Bed Mobility: Supine to Sit;Sit to Supine     Supine to sit: Mod assist Sit to supine: Mod assist   General bed mobility comments: assist for trunk and B LE's supine to/from sit (pt attempted to perform bed mobility on own but supine to sit pt had difficulty coordinating movement and kept laying down so PT assisted pt into sitting; sit to supine pt had difficulty bringing LE's into bed)  Transfers Overall transfer level: Needs assistance Equipment used: Rolling walker (2 wheeled) Transfers: Sit to/from Stand Sit to Stand: Min assist         General transfer comment: assist to steady with standing using RW  Ambulation/Gait Ambulation/Gait assistance: Min assist;+2 safety/equipment(2nd assist for safety) Ambulation Distance (Feet): 100 Feet Assistive device: Rolling walker (2 wheeled);2 person hand held  assist   Gait velocity: decreased   General Gait Details: decreased B step length; pt requiring consistent vc's to take longer steps, stand upright, look up, and stay closer to RW; ambulated 60 feet with RW and switched to hand hold assist x2 last 40 feet (still requiring vc's for upright posture/looking up and taking longer steps)   Stairs            Wheelchair Mobility    Modified Rankin (Stroke Patients Only)       Balance Overall balance assessment: Needs assistance Sitting-balance support: No upper extremity supported;Feet supported Sitting balance-Leahy Scale: Good Sitting balance - Comments: static sitting reaching within BOS   Standing balance support: No upper extremity supported Standing balance-Leahy Scale: Fair Standing balance comment: static standing steady with CGA                            Cognition Arousal/Alertness: (Pt initially sleeping upon PT arrival and drowsy upon being woken but increased alertness noted with activity) Behavior During Therapy: Impulsive Overall Cognitive Status: No family/caregiver present to determine baseline cognitive functioning(Oriented to self)                                 General Comments: Decreased insight into current situation noted.      Exercises General Exercises - Lower Extremity Long Arc Quad: AROM;Strengthening;Both;10 reps;Seated Hip Flexion/Marching: AROM;Strengthening;Both;10 reps;Seated    General Comments General comments (skin integrity, edema, etc.): Pt sleeping in hospital bed upon PT entry.      Pertinent Vitals/Pain Pain Assessment: No/denies pain  Vitals (HR and O2 on room air) stable and WFL throughout treatment session.    Home Living                      Prior Function            PT Goals (current goals can now be found in the care plan section) Acute Rehab PT Goals Patient Stated Goal: to get stronger PT Goal Formulation: With patient Time For  Goal Achievement: 10/31/17 Potential to Achieve Goals: Good Additional Goals Additional Goal #1: Perform objective balance assessment. Progress towards PT goals: Progressing toward goals    Frequency    Min 2X/week      PT Plan Current plan remains appropriate    Co-evaluation              AM-PAC PT "6 Clicks" Daily Activity  Outcome Measure  Difficulty turning over in bed (including adjusting bedclothes, sheets and blankets)?: None Difficulty moving from lying on back to sitting on the side of the bed? : Unable Difficulty sitting down on and standing up from a chair with arms (e.g., wheelchair, bedside commode, etc,.)?: Unable Help needed moving to and from a bed to chair (including a wheelchair)?: A Little Help needed walking in hospital room?: A Little Help needed climbing 3-5 steps with a railing? : A Lot 6 Click Score: 14    End of Session Equipment Utilized During Treatment: Gait belt Activity Tolerance: Patient tolerated treatment well Patient left: in bed(clip alarm in place; sitter present) Nurse Communication: Mobility status;Precautions PT Visit Diagnosis: Other abnormalities of gait and mobility (R26.89);Muscle weakness (generalized) (M62.81)     Time: [id number] PT Time Calculation (min) (ACUTE ONLY): 23 min  Charges:  $Gait Training: 8-22 mins $Therapeutic Exercise: 8-22 mins                    G CodesHendricks Limes:      Paighton Godette, PT 10/22/17, 3:41 PM [phone]

## 2017-10-22 NOTE — ED Notes (Signed)
Helped patient to bathroom. 

## 2017-10-22 NOTE — ED Notes (Signed)
EDT Beth reported to this RN that she heard a noise from patients room and went in to find her sitting in the floor. No injuries noted and pt denied pain. Pt assisted out of floor and ambulated with assistance to the bathroom.

## 2017-10-22 NOTE — ED Notes (Signed)
BEHAVIORAL HEALTH ROUNDING Patient sleeping: Yes.   Patient alert and oriented: not applicable SLEEPING Behavior appropriate: Yes.  ; If no, describe: SLEEPING Nutrition and fluids offered: No SLEEPING Toileting and hygiene offered: NoSLEEPING Sitter present:1 to 1 safety sitter , Q 15 min safety rounds and observation. Law enforcement present: Yes ODS 

## 2017-10-22 NOTE — ED Notes (Signed)
PATIENT SLEEPING WILL SET TRAY UP WHEN PATIENT AWAKES.

## 2017-10-22 NOTE — ED Notes (Signed)
Left message for Jamesetta Sohyllis, daughter and POA, to call. Need to inform her of the pt's fall last night.

## 2017-10-22 NOTE — ED Notes (Addendum)
BEHAVIORAL HEALTH ROUNDING Patient sleeping: Yes.   Patient alert and oriented: not applicable SLEEPING Behavior appropriate: Yes.  ; If no, describe: SLEEPING Nutrition and fluids offered: No SLEEPING Toileting and hygiene offered: NoSLEEPING Sitter present:1 to 1 safety sitter , Q 15 min safety rounds and observation. Law enforcement present: Yes ODS

## 2017-10-22 NOTE — ED Notes (Addendum)
BEHAVIORAL HEALTH ROUNDING  Patient sleeping: No.  Patient alert and oriented: yes  Behavior appropriate: Yes. ; If no, describe:  Nutrition and fluids offered: Yes  Toileting and hygiene offered: Yes  Sitter present:1 to 1 safety sitter , Q 15 min safety rounds and observation.  Law enforcement present: Yes ODS  

## 2017-10-22 NOTE — ED Notes (Signed)
Patient walked to bathroom with stand -by assist , patient urinated and had large stool .

## 2017-10-22 NOTE — ED Notes (Addendum)
BEHAVIORAL HEALTH ROUNDING  Patient sleeping: No.  Patient alert and oriented: yes  Behavior appropriate: Yes. ; If no, describe:  Nutrition and fluids offered: Yes  Toileting and hygiene offered: Yes  Sitter present: 1 to 1 safety sitter Q 15 min safety rounds and observation.  Law enforcement present: Yes ODS

## 2017-10-22 NOTE — ED Notes (Signed)
Gave patient food tray 

## 2017-10-22 NOTE — ED Notes (Signed)
Patient walked to bathroom with stand -by assist patient had large loose stool and urinated and cleaned patient's bottom and placed new diaper ,and pants.

## 2017-10-22 NOTE — ED Notes (Signed)
Helped patient to the bathroom. 

## 2017-10-23 DIAGNOSIS — G301 Alzheimer's disease with late onset: Secondary | ICD-10-CM | POA: Diagnosis not present

## 2017-10-23 DIAGNOSIS — F0281 Dementia in other diseases classified elsewhere with behavioral disturbance: Secondary | ICD-10-CM | POA: Diagnosis not present

## 2017-10-23 MED ORDER — HYDROXYZINE HCL 25 MG PO TABS
50.0000 mg | ORAL_TABLET | Freq: Once | ORAL | Status: AC
  Administered 2017-10-23: 50 mg via ORAL
  Filled 2017-10-23: qty 2

## 2017-10-23 NOTE — Clinical Social Work Note (Signed)
CS[facility] and she stated that her Director of Nursing is concerned in taking her due to documentation by ED staff stating she is agitated with staff, etc. CSW asked Rayfield CitizenCaroline to call the nurse of patient in the ED and she did. She asked that documentation be placed that patient is not combative, etc and that he is potentially sundowning. Dr. Scotty CourtStafford call CSW and wanted update. Update provided and Dr. Scotty CourtStafford would like someone to speak with patient's family about pushing for arrangements to be made for taking patient home. York SpanielMonica Alka Falwell MSW,LCSW [phone]

## 2017-10-23 NOTE — ED Provider Notes (Signed)
-----------------------------------------   6:24 AM on 10/23/2017 -----------------------------------------   Blood pressure (!) 152/69, pulse 84, temperature 99 F (37.2 C), temperature source Oral, resp. rate 18, height 5\' 4"  (1.626 m), weight 59.9 kg (132 lb), SpO2 100 %.  The patient had no acute events since last update.  Sleeping at this time.  Disposition is pending Psychiatry/Behavioral Medicine team recommendations.     Irean HongSung, Terrence Wishon J, MD 10/23/17 [phone]

## 2017-10-23 NOTE — ED Notes (Signed)
Pt becoming agitated with this tech presents. Pt becoming a little more altered then before, RN Sherrie made aware

## 2017-10-23 NOTE — ED Notes (Signed)
Pt is having a difficult time resting. New order received. Pt given medication to rest and covered with blankets. Pt is calm at this time.

## 2017-10-23 NOTE — ED Notes (Signed)
Pt repositioned in bed by this tech, pt trying to get out of bed to "get some coffee" tech explained to pt that it is midnight and no coffee was available at this hour

## 2017-10-23 NOTE — ED Notes (Signed)
Pt given meal tray.

## 2017-10-23 NOTE — ED Notes (Signed)
In to assist tech with bathroom assistance. Pt is confused but states she isnt. Able to get pt up to bedside commode without incident. Pt Back in bed. Spoke with pt some about what would help her sleep and discussed weighted blankets. Pt agreed that it would help. 4  blankets placed on pt. Pt spoke about her sleep machine. Tech asked if pt liked classical music. Pt said yes. TV turned off and classical music turned on for pt to assist with sleep.

## 2017-10-23 NOTE — ED Notes (Signed)
Patient has been cooperative during shift thus far at 11am for this nurse and staff.  Patient calmly lying in bed, requests when to use the restroom and cooperatively walks with staff, and has eaten lunch and taken medications without problem.  Patient was trying to get out of bed but stated wanted to sit in a chair.  Patient now calmly sitting in a recliner folding towels and playing with a busy belt.

## 2017-10-23 NOTE — ED Notes (Signed)
Spoke with Marcell BarlowIrene Fanelli, patient's niece, who reports due to illness in the family, she has been asked to take point on patient's care. Phone #[phone]

## 2017-10-23 NOTE — ED Notes (Signed)
Pt assisted to bedside toilet by this tech. Pt placed back in bed and covered up with warm blanket

## 2017-10-23 NOTE — ED Notes (Signed)
Pt becoming aggressive as well as verbally abusive with this tech, pt not wanting to stay in bed nor wanting to lay down, Rn sherrie notified

## 2017-10-23 NOTE — ED Notes (Signed)
Pt assisted to bedside toilet by this tech and RN Sherrie, pt assisted back in bed and covered up with multiple blankets per pt request. Pt resting peacefully at this time

## 2017-10-23 NOTE — ED Notes (Signed)
Music turned off and Tv turned back on, pt having to be redirected often with little to no success, pt persistent on getting out of bed to "go to the kitchen and get her some coffee"

## 2017-10-23 NOTE — Consult Note (Signed)
[facility] Face-to-Face Psychiatry Consult   Reason for Consult: Follow-up consult 83 year old woman with dementia Referring Physician: Scotty Court Patient Identification: Susan Bradley MRN:  [id number] Principal Diagnosis: Dementia Diagnosis:   Patient Active Problem List   Diagnosis Date Noted  . Dementia [F03.90] 10/16/2017  . Near syncope [R55] 10/06/2017  . Generalized weakness [R53.1] 10/06/2017  . Bradycardia [R00.1] 10/06/2017  . Ambulatory dysfunction [R26.2] 10/06/2017  . CKD (chronic kidney disease) stage 3, GFR 30-59 ml/min (HCC) [N18.3]     Total Time spent with patient: 15 minutes  Subjective:   Susan Bradley is a 83 y.o. female patient admitted with patient has nothing new to add.  HPI: See previous notes.  83 year old woman with dementia who was having behavior problems at home.  Patient has had little to no behavior problem here in the hospital and most recently has had no problem at all.  Health appears to be stable.  No aggression no agitation.  Past Psychiatric History: No past psychiatric history other than a known history of dementia  Risk to Self: Suicidal Ideation: No Suicidal Intent: No Is patient at risk for suicide?: No Suicidal Plan?: No Access to Means: No What has been your use of drugs/alcohol within the last 12 months?: Denied use How many times?: 0 Other Self Harm Risks: denied Triggers for Past Attempts: None known Intentional Self Injurious Behavior: None Risk to Others: Homicidal Ideation: No Thoughts of Harm to Others: No Current Homicidal Intent: No Current Homicidal Plan: No Access to Homicidal Means: No Identified Victim: None History of harm to others?: (aggressive today. -prior diagnosis of dementia) Assessment of Violence: On admission Does patient have access to weapons?: No Criminal Charges Pending?: No Does patient have a court date: No Prior Inpatient Therapy: Prior Inpatient Therapy: No Prior Therapy Dates: n/a Prior Therapy  Facilty/Provider(s): n/a Reason for Treatment: n/a Prior Outpatient Therapy: Prior Outpatient Therapy: No Prior Therapy Dates: n/a Prior Therapy Facilty/Provider(s): n/a Reason for Treatment: n/a Does patient have an ACCT team?: No Does patient have Intensive In-House Services?  : No Does patient have [facility] services? : No Does patient have P4CC services?: No  Past Medical History:  Past Medical History:  Diagnosis Date  . CKD (chronic kidney disease) stage 3, GFR 30-59 ml/min (HCC)   . SAH (subarachnoid hemorrhage) (HCC)    2000    Past Surgical History:  Procedure Laterality Date  . CHOLECYSTECTOMY     Family History: No family history on file. Family Psychiatric  History: None known Social History:  Social History   Substance and Sexual Activity  Alcohol Use No     Social History   Substance and Sexual Activity  Drug Use No    Social History   Socioeconomic History  . Marital status: Married    Spouse name: None  . Number of children: None  . Years of education: None  . Highest education level: None  Social Needs  . Financial resource strain: Not very hard  . Food insecurity - worry: Never true  . Food insecurity - inability: Never true  . Transportation needs - medical: No  . Transportation needs - non-medical: None  Occupational History  . None  Tobacco Use  . Smoking status: Former Smoker    Last attempt to quit: 08/06/2017    Years since quitting: 0.2  . Smokeless tobacco: Never Used  Substance and Sexual Activity  . Alcohol use: No  . Drug use: No  . Sexual activity: No  Other Topics Concern  .  None  Social History Narrative  . None   Additional Social History:    Allergies:   Allergies  Allergen Reactions  . Haldol [Haloperidol Lactate] Other (See Comments)    agitation  . Seroquel [Quetiapine] Other (See Comments)    Makes pt's agitation worse.    Labs: No results found for this or any previous visit (from the past 48  hour(s)).  Current Facility-Administered Medications  Medication Dose Route Frequency Provider Last Rate Last Dose  . amLODipine (NORVASC) tablet 5 mg  5 mg Oral Daily Sharman CheekStafford, Phillip, MD   5 mg at 10/23/17 1301  . dorzolamide (TRUSOPT) 2 % ophthalmic solution 1 drop  1 drop Both Eyes BID Sharman CheekStafford, Phillip, MD   1 drop at 10/23/17 1304  . famotidine (PEPCID) tablet 40 mg  40 mg Oral Daily Sharman CheekStafford, Phillip, MD   40 mg at 10/23/17 1303  . gabapentin (NEURONTIN) capsule 200 mg  200 mg Oral TID Emily FilbertWilliams, Jonathan E, MD   200 mg at 10/23/17 1630  . hydrochlorothiazide (MICROZIDE) capsule 12.5 mg  12.5 mg Oral Daily Sharman CheekStafford, Phillip, MD   12.5 mg at 10/23/17 1301  . lisinopril (PRINIVIL,ZESTRIL) tablet 40 mg  40 mg Oral Daily Sharman CheekStafford, Phillip, MD   40 mg at 10/23/17 1254  . memantine (NAMENDA) tablet 10 mg  10 mg Oral BID Sharman CheekStafford, Phillip, MD   10 mg at 10/23/17 1301  . potassium chloride SA (K-DUR,KLOR-CON) CR tablet 10 mEq  10 mEq Oral Daily Sharman CheekStafford, Phillip, MD   10 mEq at 10/23/17 1303  . risperiDONE (RISPERDAL M-TABS) disintegrating tablet 0.5 mg  0.5 mg Oral QHS Clapacs, John T, MD   0.5 mg at 10/22/17 2128  . sertraline (ZOLOFT) tablet 200 mg  200 mg Oral Daily Sharman CheekStafford, Phillip, MD   200 mg at 10/23/17 1259  . timolol (TIMOPTIC) 0.5 % ophthalmic solution 1 drop  1 drop Both Eyes BID Sharman CheekStafford, Phillip, MD   1 drop at 10/23/17 1306   Current Outpatient Medications  Medication Sig Dispense Refill  . timolol (BETIMOL) 0.5 % ophthalmic solution Place 1 drop into both eyes 2 (two) times daily.    Marland Kitchen. amLODipine (NORVASC) 5 MG tablet Take 1 tablet (5 mg total) by mouth daily. 30 tablet 1  . dorzolamide (TRUSOPT) 2 % ophthalmic solution Place 1 drop into both eyes 2 (two) times daily. 10 mL 1  . famotidine (PEPCID) 40 MG tablet Take 1 tablet (40 mg total) by mouth daily. 30 tablet 0  . gabapentin (NEURONTIN) 100 MG capsule Take 2 capsules (200 mg total) by mouth 3 (three) times daily. 180  capsule 1  . hydrochlorothiazide (MICROZIDE) 12.5 MG capsule Take 1 capsule (12.5 mg total) by mouth daily. 30 capsule 1  . lisinopril (PRINIVIL,ZESTRIL) 40 MG tablet Take 1 tablet (40 mg total) by mouth daily. 30 tablet 1  . memantine (NAMENDA) 10 MG tablet Take 1 tablet (10 mg total) by mouth 2 (two) times daily. 60 tablet 1  . potassium chloride SA (K-DUR,KLOR-CON) 10 MEQ tablet Take 1 tablet (10 mEq total) by mouth daily. 30 tablet 1  . risperiDONE (RISPERDAL M-TABS) 0.5 MG disintegrating tablet Take 1 tablet (0.5 mg total) by mouth at bedtime. 30 tablet 1  . sertraline (ZOLOFT) 100 MG tablet Take 2 tablets (200 mg total) by mouth daily. 60 tablet 0  . timolol (TIMOPTIC) 0.5 % ophthalmic solution Place 1 drop into both eyes 2 (two) times daily. 10 mL 1    Musculoskeletal: Strength &  Muscle Tone: decreased Gait & Station: unsteady Patient leans: N/A  Psychiatric Specialty Exam: Physical Exam  Nursing note and vitals reviewed. Constitutional: She appears well-developed and well-nourished.  HENT:  Head: Normocephalic and atraumatic.  Eyes: Conjunctivae are normal. Pupils are equal, round, and reactive to light.  Neck: Normal range of motion.  Cardiovascular: Regular rhythm and normal heart sounds.  Respiratory: Effort normal. No respiratory distress.  GI: Soft.  Musculoskeletal: Normal range of motion.  Neurological: She is alert.  Skin: Skin is warm and dry.  Psychiatric: Her affect is blunt. Her speech is delayed. She is slowed and withdrawn. Thought content is not paranoid. Cognition and memory are impaired. She expresses no homicidal and no suicidal ideation. She exhibits abnormal recent memory and abnormal remote memory.    Review of Systems  Unable to perform ROS: Mental status change    Blood pressure (!) 152/69, pulse 84, temperature 99 F (37.2 C), temperature source Oral, resp. rate 18, height 5\' 4"  (1.626 m), weight 59.9 kg (132 lb), SpO2 100 %.Body mass index is 22.66  kg/m.  General Appearance: Casual  Eye Contact:  Fair  Speech:  Slow  Volume:  Decreased  Mood:  Euthymic  Affect:  Constricted  Thought Process:  Disorganized  Orientation:  Negative  Thought Content:  Negative  Suicidal Thoughts:  No  Homicidal Thoughts:  No  Memory:  Immediate;   Fair Recent;   Poor Remote;   Poor  Judgement:  Negative  Insight:  Negative  Psychomotor Activity:  Negative  Concentration:  Concentration: Poor  Recall:  Negative  Fund of Knowledge:  Negative  Language:  Negative  Akathisia:  Negative  Handed:  Right  AIMS (if indicated):     Assets:  Financial Resources/Insurance  ADL's:  Impaired  Cognition:  Impaired,  Moderate  Sleep:        Treatment Plan Summary: Plan 83 year old woman with dementia who has been waiting quite a long time in our emergency room for replacement.  Patient is psychiatrically stable.  Recent note suggests that living facilities are continuing to refuse to take the patient based on alleged concerns about problems that have been repeatedly documented as nonexistent which raises the concern about bad faith on the part of facilities.  Patient is not agitated not aggressive not dangerous and simply needs placement for her dementia.  No change to treatment plan at this point.  Disposition: Patient does not meet criteria for psychiatric inpatient admission.  Mordecai RasmussenJohn Clapacs, MD 10/23/2017 5:34 PM

## 2017-10-23 NOTE — ED Notes (Signed)
Pt assisted to bedside toilet by this tech

## 2017-10-24 ENCOUNTER — Emergency Department: Payer: Medicare HMO

## 2017-10-24 DIAGNOSIS — G301 Alzheimer's disease with late onset: Secondary | ICD-10-CM | POA: Diagnosis not present

## 2017-10-24 DIAGNOSIS — F0281 Dementia in other diseases classified elsewhere with behavioral disturbance: Secondary | ICD-10-CM | POA: Diagnosis not present

## 2017-10-24 NOTE — ED Notes (Signed)
Spoke with Jamesetta Sohyllis patient's daughter who has passcode. She is wanting an update, informed her there is no plan for disposition at this time, but PT was here to evaluate her as well as [facility].  She was also asking about a lot of her medications which I did not give her much information about her medications.  She states she has a Acupuncturistmeeting tonight with CSW which I informed her she may be able to get more information when she is here in person.

## 2017-10-24 NOTE — Clinical Social Work Note (Addendum)
CSW received call from pt's daughter-Phyllis Hedrick this morning requesting a meeting with CSW and psychiatrist at 5:30 pm. Ms. Hedrick stated there were delays with placement at [facility] this past week and confusing as to why pt could not be admitted to the [facility]. Ms. Hedrick is requesting explanation from psychiatrist explaining why pt is not appropriate for geri-psych. CSW informed Ms. Hedrick that CSW would connect with psych MD to inform of her request.   CSW met with Caroline Markham (Admission at [facility]) who was waiting to reassess pt while Physical Therapy was completing their evaluation. Ms. Markham stated after seeing the pt today, Ms. Markham will restaff with her Director Of Nursing (DON) to see if she will reconsider admitting pt. Ms. Markham states DON is concerned over documentation of pt being aggressive and combative, at one time. Ms. Markham to keep CSW updated. CSW updated with Dr. Kinner and Social Work Director-Hope Rife.   [facility] Director of Nursing (DON) assessed pt at bedside today. DON states she will have an answer for us on Thursday at 10am, after reviewing with her team. CSW met with pt's family to provide update and discuss possible future placement options. CSW continuing to follow.    , LCSWA, LCASA Clinical Social Worker-Emergency Department [phone] 

## 2017-10-24 NOTE — Progress Notes (Signed)
Physical Therapy Treatment Patient Details Name: Susan Bradley MRN: [id number] DOB: 1932-10-18 Today's Date: 10/24/2017    History of Present Illness Pt is an 83 y.o. female presenting to ED with AMS/increased aggressive behavior with behavioral disturbances.  PMH includes CKD, SAH, dementia, near syncope, bradycardia.    PT Comments    Trialed pt with B hand hold assist today but pt appeared with better upright posture ambulating with no AD--min assist to steady (although pt still requiring cues to look up and not down at floor).  SOB noted with distance ambulated (O2 94% on room air post ambulation).  Pt appearing with generalized weakness.  Deferred use of RW d/t pt's h/o of difficulty managing RW when previously trialed.  BSC noted in pt's room but pt would benefit from ambulating to bathroom with staff to prevent further deconditioning (nursing notified).  Will continue to progress pt with strength, balance, ambulation, and progressive functional mobility.   Follow Up Recommendations  SNF     Equipment Recommendations  None recommended by PT    Recommendations for Other Services       Precautions / Restrictions Precautions Precautions: Fall Restrictions Weight Bearing Restrictions: No    Mobility  Bed Mobility Overal bed mobility: Needs Assistance Bed Mobility: Supine to Sit     Supine to sit: Min assist     General bed mobility comments: Pt requiring vc's and initial assist for LE's to initiate movement OOB but then pt able to perform with close SBA with increased effort/time noted  Transfers Overall transfer level: Needs assistance Equipment used: 1 person hand held assist Transfers: Sit to/from Stand Sit to Stand: Min assist         General transfer comment: assist to steady with standing (from bed x1 trial and from recliner x2 trials)  Ambulation/Gait Ambulation/Gait assistance: Min assist;+2 safety/equipment Ambulation Distance (Feet): 110 Feet Assistive  device: 2 person hand held assist;None   Gait velocity: decreased   General Gait Details: pt demonstrating shuffling gait requiring intermittent vc's to take longer steps and also for upright posture (pt looking down at floor and reporting she liked to look at the colors on the floor); pt intermittently responding to vc's but unable to consistently maintain improved technique; initially trialed hand hold assist x2 but pt with forward flexed posture and did better (with trunk posture) with no hand held assist   Stairs            Wheelchair Mobility    Modified Rankin (Stroke Patients Only)       Balance Overall balance assessment: Needs assistance Sitting-balance support: No upper extremity supported;Feet supported Sitting balance-Leahy Scale: Good Sitting balance - Comments: static sitting reaching within BOS   Standing balance support: No upper extremity supported Standing balance-Leahy Scale: Fair Standing balance comment: static standing steady with CGA                            Cognition Arousal/Alertness: Awake/alert Behavior During Therapy: WFL for tasks assessed/performed Overall Cognitive Status: No family/caregiver present to determine baseline cognitive functioning(Oriented to self)                                 General Comments: Decreased insight into current situation noted.      Exercises      General Comments General comments (skin integrity, edema, etc.): Pt resting in hospital bed upon PT  entry.  Upon sitting pt up pt noted to be wet on her back and bottom (nursing reports pt recently spilled water) and pt assisted (with NT assist) to change into dry clothes.      Pertinent Vitals/Pain Pain Assessment: No/denies pain Pain Intervention(s): Monitored during session  Vitals (HR and O2 on room air) stable and WFL throughout treatment session.    Home Living                      Prior Function            PT  Goals (current goals can now be found in the care plan section) Acute Rehab PT Goals Patient Stated Goal: to get stronger PT Goal Formulation: With patient Time For Goal Achievement: 10/31/17 Potential to Achieve Goals: Good Additional Goals Additional Goal #1: Perform objective balance assessment. Progress towards PT goals: Progressing toward goals    Frequency    Min 2X/week      PT Plan Current plan remains appropriate    Co-evaluation              AM-PAC PT "6 Clicks" Daily Activity  Outcome Measure  Difficulty turning over in bed (including adjusting bedclothes, sheets and blankets)?: None Difficulty moving from lying on back to sitting on the side of the bed? : Unable Difficulty sitting down on and standing up from a chair with arms (e.g., wheelchair, bedside commode, etc,.)?: Unable Help needed moving to and from a bed to chair (including a wheelchair)?: A Little Help needed walking in hospital room?: A Little Help needed climbing 3-5 steps with a railing? : A Lot 6 Click Score: 14    End of Session Equipment Utilized During Treatment: Gait belt Activity Tolerance: Patient tolerated treatment well Patient left: in chair;with nursing/sitter in room Nurse Communication: Mobility status;Precautions PT Visit Diagnosis: Other abnormalities of gait and mobility (R26.89);Muscle weakness (generalized) (M62.81)     Time: 1030-1053 PT Time Calculation (min) (ACUTE ONLY): 23 min  Charges:  $Gait Training: 8-22 mins $Therapeutic Activity: 8-22 mins                    G CodesHendricks Limes:       Staci Carver, PT 10/24/17, 1:38 PM [phone]

## 2017-10-24 NOTE — ED Notes (Signed)
PT working with patient at this time, pt up in the hallway.  Representative from [place] also waiting to assess pt.  CSW aware she is here.

## 2017-10-24 NOTE — ED Notes (Signed)
The patient was given a lunch tray.

## 2017-10-24 NOTE — ED Provider Notes (Signed)
-----------------------------------------   2:35 AM on 10/24/2017 -----------------------------------------   Blood pressure (!) 152/69, pulse 84, temperature 99 F (37.2 C), temperature source Oral, resp. rate 18, height 5\' 4"  (1.626 m), weight 59.9 kg (132 lb), SpO2 100 %.  The patient had no acute events since last update.  Calm and cooperative at this time.  Disposition is pending Psychiatry/Behavioral Medicine team recommendations.     Merrily Brittleifenbark, Derra Shartzer, MD 10/24/17 [phone]

## 2017-10-24 NOTE — ED Provider Notes (Signed)
SW is contacting [facility]   Jene EveryKinner, Vince Ainsley, MD 10/24/17 1045

## 2017-10-24 NOTE — ED Notes (Signed)
Breakfast tray given to patient.

## 2017-10-24 NOTE — ED Notes (Signed)
Supper tray given to pt sitter.

## 2017-10-24 NOTE — ED Notes (Addendum)
DON from [facility] meeting with pt at this time.    DON states "will take the review back to the team and we will discuss it". Admission TBD

## 2017-10-24 NOTE — ED Notes (Addendum)
Informed Dr. Cyril LoosenKinner of patients cough and tachypnea.  New order for XR received at this time. Pt sitting up in chair at this time.

## 2017-10-24 NOTE — Consult Note (Signed)
[facility] Face-to-Face Psychiatry Consult   Reason for Consult: Follow-up consult for 83 year old woman with dementia who remains in the emergency room while we are seeking placement Referring Physician: Scotty Court Patient Identification: Ashanta Amoroso MRN:  [id number] Principal Diagnosis: Dementia Diagnosis:   Patient Active Problem List   Diagnosis Date Noted  . Dementia [F03.90] 10/16/2017  . Near syncope [R55] 10/06/2017  . Generalized weakness [R53.1] 10/06/2017  . Bradycardia [R00.1] 10/06/2017  . Ambulatory dysfunction [R26.2] 10/06/2017  . CKD (chronic kidney disease) stage 3, GFR 30-59 ml/min (HCC) [N18.3]     Total Time spent with patient: 20 minutes  Subjective:   Sabrinia Prien is a 83 y.o. female patient admitted with patient not able to give any specific complaint.  HPI: See previous notes.  83 year old woman who has been in the emergency room for many days now.  Initially brought in after displaying agitated behavior and a spell of unresponsiveness at home.  Found to be dehydrated.  Since her first day in the emergency room behavior has been fairly unremarkable given her level of dementia.  Patient requires assistance with many ADLs.  Has not however been violent or hostile.  Documentation suggests that she is typically easy to redirect.  Patient was having some episodes of "sundowning" which have improved significantly since introducing a small dose of Risperdal in the evening.  I spoke with the patient's daughter today who implied significant dissatisfaction with the course of treatment.  I defended the decision not to push for geriatric psychiatry admission based on the fact that the patient's symptoms have been fairly mild and well controlled without dangerous psychosis or agitation for many days in this setting and that her primary need appeared to be and still appears to be for safe placement and care.  Past Psychiatric History: Past history of dementia  Risk to Self: Suicidal  Ideation: No Suicidal Intent: No Is patient at risk for suicide?: No Suicidal Plan?: No Access to Means: No What has been your use of drugs/alcohol within the last 12 months?: Denied use How many times?: 0 Other Self Harm Risks: denied Triggers for Past Attempts: None known Intentional Self Injurious Behavior: None Risk to Others: Homicidal Ideation: No Thoughts of Harm to Others: No Current Homicidal Intent: No Current Homicidal Plan: No Access to Homicidal Means: No Identified Victim: None History of harm to others?: (aggressive today. -prior diagnosis of dementia) Assessment of Violence: On admission Does patient have access to weapons?: No Criminal Charges Pending?: No Does patient have a court date: No Prior Inpatient Therapy: Prior Inpatient Therapy: No Prior Therapy Dates: n/a Prior Therapy Facilty/Provider(s): n/a Reason for Treatment: n/a Prior Outpatient Therapy: Prior Outpatient Therapy: No Prior Therapy Dates: n/a Prior Therapy Facilty/Provider(s): n/a Reason for Treatment: n/a Does patient have an ACCT team?: No Does patient have Intensive In-House Services?  : No Does patient have [facility] services? : No Does patient have P4CC services?: No  Past Medical History:  Past Medical History:  Diagnosis Date  . CKD (chronic kidney disease) stage 3, GFR 30-59 ml/min (HCC)   . SAH (subarachnoid hemorrhage) (HCC)    2000    Past Surgical History:  Procedure Laterality Date  . CHOLECYSTECTOMY     Family History: No family history on file. Family Psychiatric  History: None reported Social History:  Social History   Substance and Sexual Activity  Alcohol Use No     Social History   Substance and Sexual Activity  Drug Use No    Social History  Socioeconomic History  . Marital status: Married    Spouse name: None  . Number of children: None  . Years of education: None  . Highest education level: None  Social Needs  . Financial resource strain: Not  very hard  . Food insecurity - worry: Never true  . Food insecurity - inability: Never true  . Transportation needs - medical: No  . Transportation needs - non-medical: None  Occupational History  . None  Tobacco Use  . Smoking status: Former Smoker    Last attempt to quit: 08/06/2017    Years since quitting: 0.2  . Smokeless tobacco: Never Used  Substance and Sexual Activity  . Alcohol use: No  . Drug use: No  . Sexual activity: No  Other Topics Concern  . None  Social History Narrative  . None   Additional Social History:    Allergies:   Allergies  Allergen Reactions  . Haldol [Haloperidol Lactate] Other (See Comments)    agitation  . Seroquel [Quetiapine] Other (See Comments)    Makes pt's agitation worse.    Labs: No results found for this or any previous visit (from the past 48 hour(s)).  Current Facility-Administered Medications  Medication Dose Route Frequency Provider Last Rate Last Dose  . amLODipine (NORVASC) tablet 5 mg  5 mg Oral Daily Sharman Cheek, MD   5 mg at 10/24/17 1026  . dorzolamide (TRUSOPT) 2 % ophthalmic solution 1 drop  1 drop Both Eyes BID Sharman Cheek, MD   1 drop at 10/24/17 1032  . famotidine (PEPCID) tablet 40 mg  40 mg Oral Daily Sharman Cheek, MD   40 mg at 10/24/17 1025  . gabapentin (NEURONTIN) capsule 200 mg  200 mg Oral TID Emily Filbert, MD   200 mg at 10/24/17 1700  . hydrochlorothiazide (MICROZIDE) capsule 12.5 mg  12.5 mg Oral Daily Sharman Cheek, MD   12.5 mg at 10/24/17 1028  . lisinopril (PRINIVIL,ZESTRIL) tablet 40 mg  40 mg Oral Daily Sharman Cheek, MD   40 mg at 10/24/17 1027  . memantine (NAMENDA) tablet 10 mg  10 mg Oral BID Sharman Cheek, MD   10 mg at 10/24/17 1025  . potassium chloride SA (K-DUR,KLOR-CON) CR tablet 10 mEq  10 mEq Oral Daily Sharman Cheek, MD   10 mEq at 10/24/17 1026  . risperiDONE (RISPERDAL M-TABS) disintegrating tablet 0.5 mg  0.5 mg Oral QHS Dquan Cortopassi T, MD   0.5  mg at 10/23/17 2204  . sertraline (ZOLOFT) tablet 200 mg  200 mg Oral Daily Sharman Cheek, MD   200 mg at 10/24/17 1027  . timolol (TIMOPTIC) 0.5 % ophthalmic solution 1 drop  1 drop Both Eyes BID Sharman Cheek, MD   1 drop at 10/24/17 1032   Current Outpatient Medications  Medication Sig Dispense Refill  . timolol (BETIMOL) 0.5 % ophthalmic solution Place 1 drop into both eyes 2 (two) times daily.    Marland Kitchen amLODipine (NORVASC) 5 MG tablet Take 1 tablet (5 mg total) by mouth daily. 30 tablet 1  . dorzolamide (TRUSOPT) 2 % ophthalmic solution Place 1 drop into both eyes 2 (two) times daily. 10 mL 1  . famotidine (PEPCID) 40 MG tablet Take 1 tablet (40 mg total) by mouth daily. 30 tablet 0  . gabapentin (NEURONTIN) 100 MG capsule Take 2 capsules (200 mg total) by mouth 3 (three) times daily. 180 capsule 1  . hydrochlorothiazide (MICROZIDE) 12.5 MG capsule Take 1 capsule (12.5 mg total) by mouth  daily. 30 capsule 1  . lisinopril (PRINIVIL,ZESTRIL) 40 MG tablet Take 1 tablet (40 mg total) by mouth daily. 30 tablet 1  . memantine (NAMENDA) 10 MG tablet Take 1 tablet (10 mg total) by mouth 2 (two) times daily. 60 tablet 1  . potassium chloride SA (K-DUR,KLOR-CON) 10 MEQ tablet Take 1 tablet (10 mEq total) by mouth daily. 30 tablet 1  . risperiDONE (RISPERDAL M-TABS) 0.5 MG disintegrating tablet Take 1 tablet (0.5 mg total) by mouth at bedtime. 30 tablet 1  . sertraline (ZOLOFT) 100 MG tablet Take 2 tablets (200 mg total) by mouth daily. 60 tablet 0  . timolol (TIMOPTIC) 0.5 % ophthalmic solution Place 1 drop into both eyes 2 (two) times daily. 10 mL 1    Musculoskeletal: Strength & Muscle Tone: decreased Gait & Station: unsteady Patient leans: N/A  Psychiatric Specialty Exam: Physical Exam  Nursing note and vitals reviewed. Constitutional: She appears well-developed and well-nourished.  HENT:  Head: Normocephalic and atraumatic.  Eyes: Conjunctivae are normal. Pupils are equal, round,  and reactive to light.  Neck: Normal range of motion.  Cardiovascular: Regular rhythm and normal heart sounds.  Respiratory: Effort normal. No respiratory distress.  GI: Soft.  Musculoskeletal: Normal range of motion.  Neurological: She is alert.  Skin: Skin is warm and dry.  Psychiatric: Her affect is blunt. Her speech is delayed. She is slowed. Thought content is not paranoid. Cognition and memory are impaired. She expresses inappropriate judgment. She expresses no homicidal and no suicidal ideation. She exhibits abnormal recent memory.    Review of Systems  Unable to perform ROS: Dementia  Psychiatric/Behavioral: Negative for depression.    Blood pressure (!) 109/50, pulse 83, temperature 99.1 F (37.3 C), temperature source Oral, resp. rate 16, height 5\' 4"  (1.626 m), weight 59.9 kg (132 lb), SpO2 96 %.Body mass index is 22.66 kg/m.  General Appearance: Casual  Eye Contact:  Fair  Speech:  Slow  Volume:  Decreased  Mood:  Euthymic  Affect:  Constricted  Thought Process:  Disorganized  Orientation:  Negative  Thought Content:  Rumination  Suicidal Thoughts:  No  Homicidal Thoughts:  No  Memory:  Immediate;   Fair Recent;   Poor Remote;   Fair  Judgement:  Impaired  Insight:  Shallow  Psychomotor Activity:  Decreased  Concentration:  Concentration: Poor  Recall:  Poor  Fund of Knowledge:  Fair  Language:  Fair  Akathisia:  No  Handed:  Right  AIMS (if indicated):     Assets:  Desire for Improvement Social Support  ADL's:  Impaired  Cognition:  Impaired,  Moderate  Sleep:        Treatment Plan Summary: Medication management and Plan No change to medication management.  Case reviewed with social work.  I reviewed recent notes.  The patient's family seem to have been under the impression that there were multiple documentations of agitated behavior recently.  I reviewed the chart and was not able to find examples of those.  I had been present and involved with social  work last week during a time when they were actively referring the patient to a memory unit and at that point we were arguing that the patient was no longer acutely agitated.  As near as I can tell there have been no new episodes of documented agitation.  I am unclear why memory units have not accepted the patient at this point.  I am told that a visit happen this afternoon from the  nursing director of a memory care unit and that her pronouncement was that the patient may not be appropriate because they are a "ambulatory facility".  This strikes me as a concern that could have been addressed earlier.  Nevertheless at this point we will continue to work on appropriate placement.  No change to medication for today.  Disposition: No evidence of imminent risk to self or others at present.   Patient does not meet criteria for psychiatric inpatient admission.  Mordecai RasmussenJohn Leeya Rusconi, MD 10/24/2017 6:15 PM

## 2017-10-25 ENCOUNTER — Emergency Department: Payer: Medicare HMO

## 2017-10-25 DIAGNOSIS — F0281 Dementia in other diseases classified elsewhere with behavioral disturbance: Secondary | ICD-10-CM | POA: Diagnosis not present

## 2017-10-25 DIAGNOSIS — G301 Alzheimer's disease with late onset: Secondary | ICD-10-CM | POA: Diagnosis not present

## 2017-10-25 MED ORDER — GUAIFENESIN 100 MG/5ML PO SOLN
400.0000 mg | Freq: Once | ORAL | Status: AC
  Administered 2017-10-25: 400 mg via ORAL
  Filled 2017-10-25: qty 20

## 2017-10-25 NOTE — ED Notes (Signed)
BEHAVIORAL HEALTH ROUNDING Patient sleeping: No. Patient alert and oriented: yes Behavior appropriate: Yes.  ; If no, describe:  Nutrition and fluids offered: yes Toileting and hygiene offered: Yes  Sitter present: q15 minute observations and security  monitoring Law enforcement present: Yes  ODS  

## 2017-10-25 NOTE — ED Notes (Signed)
Patient's daughter in room visiting at this time.

## 2017-10-25 NOTE — ED Notes (Signed)

## 2017-10-25 NOTE — ED Notes (Signed)
Patient observed lying in a hospital bed with eyes closed  bed alarm is set  Even, unlabored respirations observed   NAD pt appears to be sleeping  I will continue to monitor along with every 15 minute visual observations and ongoing security monitoring

## 2017-10-25 NOTE — ED Notes (Signed)
PT  VOL/  PENDING  PLACEMENT 

## 2017-10-25 NOTE — Consult Note (Signed)
Psychiatry: Follow-up 83 year old woman with dementia.  Patient has no new complaints.  Case reviewed with social work and with nursing and ER physician.  Patient has not had any significant behavior problems.  Not aggressive not violent not threatening.  Tolerating treatment well.  Cooperative with therapy and with eating and drinking.  Patient had a chest x-ray today which shows continued mass.  Decision to further work this up deferred to emergency room physician.  Reviewed with social work the overall treatment plan.  Patient at this point is psychiatrically stable.  Does not show any sign that I see of requiring inpatient psychiatric treatment.  Remains very unclear to me why we have been having so much difficulty placing her.  We are still hoping for placement options to come through as soon as possible.

## 2017-10-25 NOTE — ED Notes (Signed)
Patient assist to bed side toilet by this RN without complication. Patient now resting in bed with alarm on.

## 2017-10-25 NOTE — ED Notes (Signed)
Patient going to xray

## 2017-10-25 NOTE — ED Notes (Signed)
Walked patient to bathroom ,patient urinated.

## 2017-10-25 NOTE — ED Notes (Signed)
Voluntary/ pending placement 

## 2017-10-25 NOTE — ED Provider Notes (Signed)
-----------------------------------------   6:00 AM on 10/25/2017 -----------------------------------------   Blood pressure 136/66, pulse 88, temperature 99.6 F (37.6 C), temperature source Oral, resp. rate 20, height 5\' 4"  (1.626 m), weight 59.9 kg (132 lb), SpO2 95 %.  The patient had no acute events since last update.  Calm and cooperative at this time.  Disposition is pending Psychiatry/Behavioral Medicine team recommendations.     Merrily Brittleifenbark, Eliah Marquard, MD 10/25/17 0600

## 2017-10-25 NOTE — ED Notes (Signed)
ED Is the patient under IVC or is there intent for IVC:  Is the patient medically cleared: Yes.   Is there vacancy in the ED BHU: Yes.   Is the population mix appropriate for patient: Yes.   Is the patient awaiting placement in inpatient or outpatient setting: inpt placement  Has the patient had a psychiatric consult: completed  Survey of unit performed for contraband, proper placement and condition of furniture, tampering with fixtures in bathroom, shower, and each patient room: Yes.  ; Findings:  APPEARANCE/BEHAVIOR Calm and cooperative NEURO ASSESSMENT Orientation: oriented to self, place    Denies pain Hallucinations: No.None noted (Hallucinations) Speech: Normal Gait: normal  - stand by assist  RESPIRATORY ASSESSMENT Even  Unlabored respirations  CARDIOVASCULAR ASSESSMENT Pulses equal   regular rate  Skin warm and dry   GASTROINTESTINAL ASSESSMENT no GI complaint EXTREMITIES Full ROM  PLAN OF CARE Provide calm/safe environment. Vital signs assessed twice daily. ED BHU Assessment once each 12-hour shift. Collaborate with TTS daily or as condition indicates. Assure the ED provider has rounded once each shift. Provide and encourage hygiene. Provide redirection as needed. Assess for escalating behavior; address immediately and inform ED provider.  Assess family dynamic and appropriateness for visitation as needed: Yes.  ; If necessary, describe findings:  Educate the patient/family about BHU procedures/visitation: Yes.  ; If necessary, describe findings:

## 2017-10-25 NOTE — Clinical Social Work Note (Signed)
CSW received call from daughter-Phyllis Burnett ShengHedrick [phone] stating she located a bed at [facility] in [place], [place]. Pt's PCP had already sent an FL-2 to the facility and all that was needed was a TB test. CSW updated RN. Chest Rondell Reams-ray was ordered and results recevievd. CSW called [facility] at [phone] and confirmed fax number with Annice PihJackie. CSW faxed chest x-ray results to [phone]. CSW updated daughter via phone and directed her to call ED RN on Friday to find out when pt will be ready for transport. Dtr to transport pt to facility. CSW signing off as no further Social Work needs identified.   Corlis HoveJeneya Emmalina Espericueta, Theresia MajorsLCSWA, [facility] Clinical Social Worker Emergency Department [phone]

## 2017-10-25 NOTE — ED Notes (Addendum)
Pt up to use bedside, placed back into bed by RN and tech. Bed alarm set

## 2017-10-25 NOTE — ED Notes (Signed)
Updated her daughter during there visit - NAD observed  Continue to monitor

## 2017-10-25 NOTE — ED Notes (Signed)
Patient back from x-ray 

## 2017-10-26 ENCOUNTER — Emergency Department: Payer: Medicare HMO

## 2017-10-26 LAB — URINALYSIS, COMPLETE (UACMP) WITH MICROSCOPIC
BILIRUBIN URINE: NEGATIVE
Bacteria, UA: NONE SEEN
GLUCOSE, UA: NEGATIVE mg/dL
HGB URINE DIPSTICK: NEGATIVE
KETONES UR: NEGATIVE mg/dL
LEUKOCYTES UA: NEGATIVE
NITRITE: NEGATIVE
PH: 6 (ref 5.0–8.0)
Protein, ur: NEGATIVE mg/dL
SPECIFIC GRAVITY, URINE: 1.016 (ref 1.005–1.030)

## 2017-10-26 MED ORDER — LEVOFLOXACIN 750 MG PO TABS: 750.0000 mg | ORAL_TABLET | Freq: Every day | ORAL | 0 refills | Status: AC

## 2017-10-26 MED ORDER — HYDROXYZINE HCL 25 MG PO TABS
50.0000 mg | ORAL_TABLET | Freq: Once | ORAL | Status: AC
  Administered 2017-10-26: 50 mg via ORAL

## 2017-10-26 MED ORDER — HYDROXYZINE HCL 25 MG PO TABS
ORAL_TABLET | ORAL | Status: AC
  Filled 2017-10-26: qty 2

## 2017-10-26 MED ORDER — LEVOFLOXACIN 750 MG PO TABS: 750.0000 mg | ORAL_TABLET | Freq: Once | ORAL | Status: DC

## 2017-10-26 NOTE — ED Notes (Signed)
ED Is the patient under IVC or is there intent for IVC:  Is the patient medically cleared: Yes.   Is there vacancy in the ED BHU:  Is the population mix appropriate for patient:  geriatric   Is the patient awaiting placement in inpatient or outpatient setting: Yes.   Has the patient had a psychiatric consult: Yes.   Completed  She does not meet inpt criteria  - placement/ pt may discharge to home Survey of unit performed for contraband, proper placement and condition of furniture, tampering with fixtures in bathroom, shower, and each patient room: Yes.  ; Findings:  APPEARANCE/BEHAVIOR Calm and cooperative NEURO ASSESSMENT Orientation: oriented to self and place  Denies pain Hallucinations: No.None noted (Hallucinations) Speech: Normal Gait: normal  Slow  Stand by assistance provided  RESPIRATORY ASSESSMENT Even  Unlabored respirations  CARDIOVASCULAR ASSESSMENT Pulses equal   regular rate  Skin warm and dry   GASTROINTESTINAL ASSESSMENT no GI complaint EXTREMITIES Full ROM  PLAN OF CARE Provide calm/safe environment. Vital signs assessed twice daily. ED BHU Assessment once each 12-hour shift. Collaborate with TTS daily or as condition indicates. Assure the ED provider has rounded once each shift. Provide and encourage hygiene. Provide redirection as needed. Assess for escalating behavior; address immediately and inform ED provider.  Assess family dynamic and appropriateness for visitation as needed: Yes.  ; If necessary, describe findings:  Educate the patient/family about BHU procedures/visitation: Yes.  ; If necessary, describe findings:

## 2017-10-26 NOTE — ED Provider Notes (Signed)
No events overnight, patient remains medically stable for psychiatric disposition   Susan Bradley, Jonathan E, MD 10/26/17 0830

## 2017-10-26 NOTE — Progress Notes (Signed)
LCSW spoke with facility and patients daughter. [facility] was working on all documentation and needed clarification from daughter and then they will provide a call report and room number as per Wynona Caneshristine.  LCSW called [facility] and they will call me shortly with room number. Awaiting a call back.  Patients daughter is to transport her mother to the facility.  Amyjo Mizrachi LCSW [phone]

## 2017-10-26 NOTE — ED Notes (Signed)
Pt is having a difficult time resting. New orders received.

## 2017-10-26 NOTE — ED Notes (Signed)
Patient observed lying in bed with eyes closed  Even, unlabored respirations observed   NAD pt appears to be sleeping  I will continue to monitor along with every 15 minute visual observations and ongoing security monitoring    

## 2017-10-26 NOTE — ED Provider Notes (Signed)
IMPRESSION: 2.9 cm subpleural left upper lobe mixed attenuation solid/sub solid pulmonary mass versus area of airspace consolidation.  Areas of airspace consolidation in the lingula and left lower lobe. Scattered ground-glass opacities in the right middle lobe and right lower lobe. Mucous plugging in the left lower lobe.  In the acute clinical setting, these findings may be explained by multifocal pneumonia. However, primary lung malignancy cannot be excluded in the left upper lobe. Follow-up 1 month after resolution of the acute symptoms may be considered to re-evaluate this area.  Calcific atherosclerotic disease of the coronary arteries.  Aortic Atherosclerosis (ICD10-I70.0).  Patient CT scan is as dictated above.  I will treat her with Levaquin for possible pneumonia and also advised she needs 1 month follow-up for repeat chest imaging.   Emily FilbertWilliams, Favor Hackler E, MD 10/26/17 [phone]

## 2017-10-26 NOTE — ED Notes (Signed)
BEHAVIORAL HEALTH ROUNDING Patient sleeping: Yes.   Patient alert and oriented: eyes closed  Appears to be asleep Behavior appropriate: Yes.  ; If no, describe:  Nutrition and fluids offered: Yes  Toileting and hygiene offered: sleeping Sitter present: q 15 minute observations and security monitoring Law enforcement present: yes  ODS 

## 2017-10-26 NOTE — ED Notes (Signed)

## 2017-10-26 NOTE — ED Provider Notes (Signed)
I do not see active signs of TB on patient CT imaging.  There are findings of pneumonia for which she will need follow-up x-ray imaging in 1 week.    Susan Bradley, Jonathan E, MD 10/26/17 [phone]

## 2017-10-26 NOTE — ED Notes (Signed)
Pt is anxious and agitated. Pt given towels to fold and this RN asked if she minded helping me out by folding. Pt said she would be happy to. Pt is now helping to fold clothes.

## 2017-10-26 NOTE — Progress Notes (Signed)
LCSW received a call from [facility]- and patient is able to transport.  Call report number [phone] Room 41  EDP and LCSW consulted and he will review patient x-rays and right a note for facility.  Was informed patient daughter is enroute to pick up patient.  Deidrick Rainey LCSW

## 2017-10-26 NOTE — Progress Notes (Signed)
LCSW spoke with EDP and patients daughter. EDP awaiting CT scan report and will discharge patient once reports are in and patient cleared.  LCSW spoke with daughter and she was agreeable to come back in an hour.  ED RN and EDP is aware patient to dc to daughter once cleared.  No further needs Delta Air LinesClaudine Alfreida Steffenhagen LCSW [phone]

## 2017-10-26 NOTE — ED Notes (Signed)
BEHAVIORAL HEALTH ROUNDING Patient sleeping: No. Patient alert and oriented: yes Behavior appropriate: Yes.  ; If no, describe:  Nutrition and fluids offered: yes Toileting and hygiene offered: Yes  Sitter present: q15 minute observations and security  monitoring Law enforcement present: Yes  ODS  

## 2019-03-26 IMAGING — CR DG CHEST 2V
2 series · 2 of 2 positions shown · non-contrast
Comparison: None.

CLINICAL DATA: Altered mental status, lethargy, and loss of balance
today.

EXAM:
CHEST  2 VIEW

[chest lat]
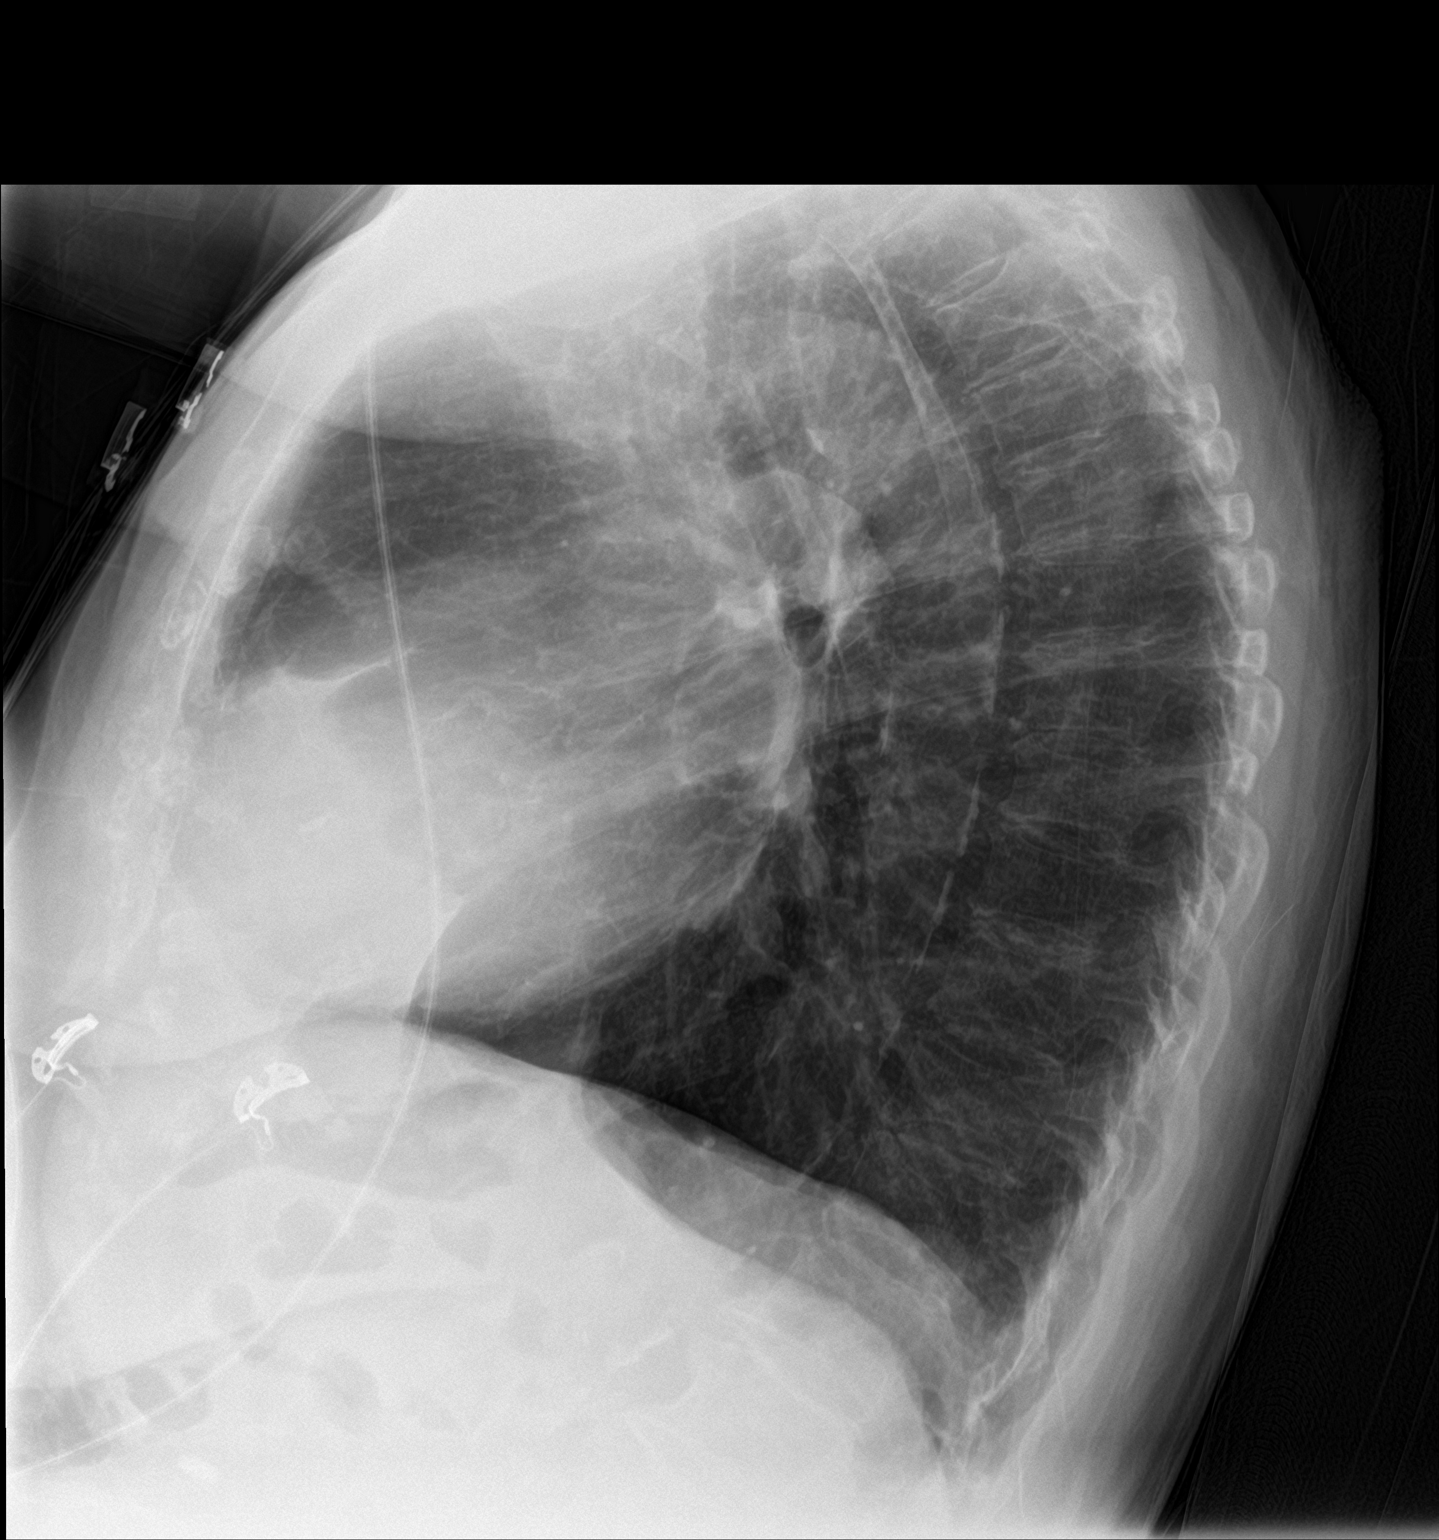

[chest ap]
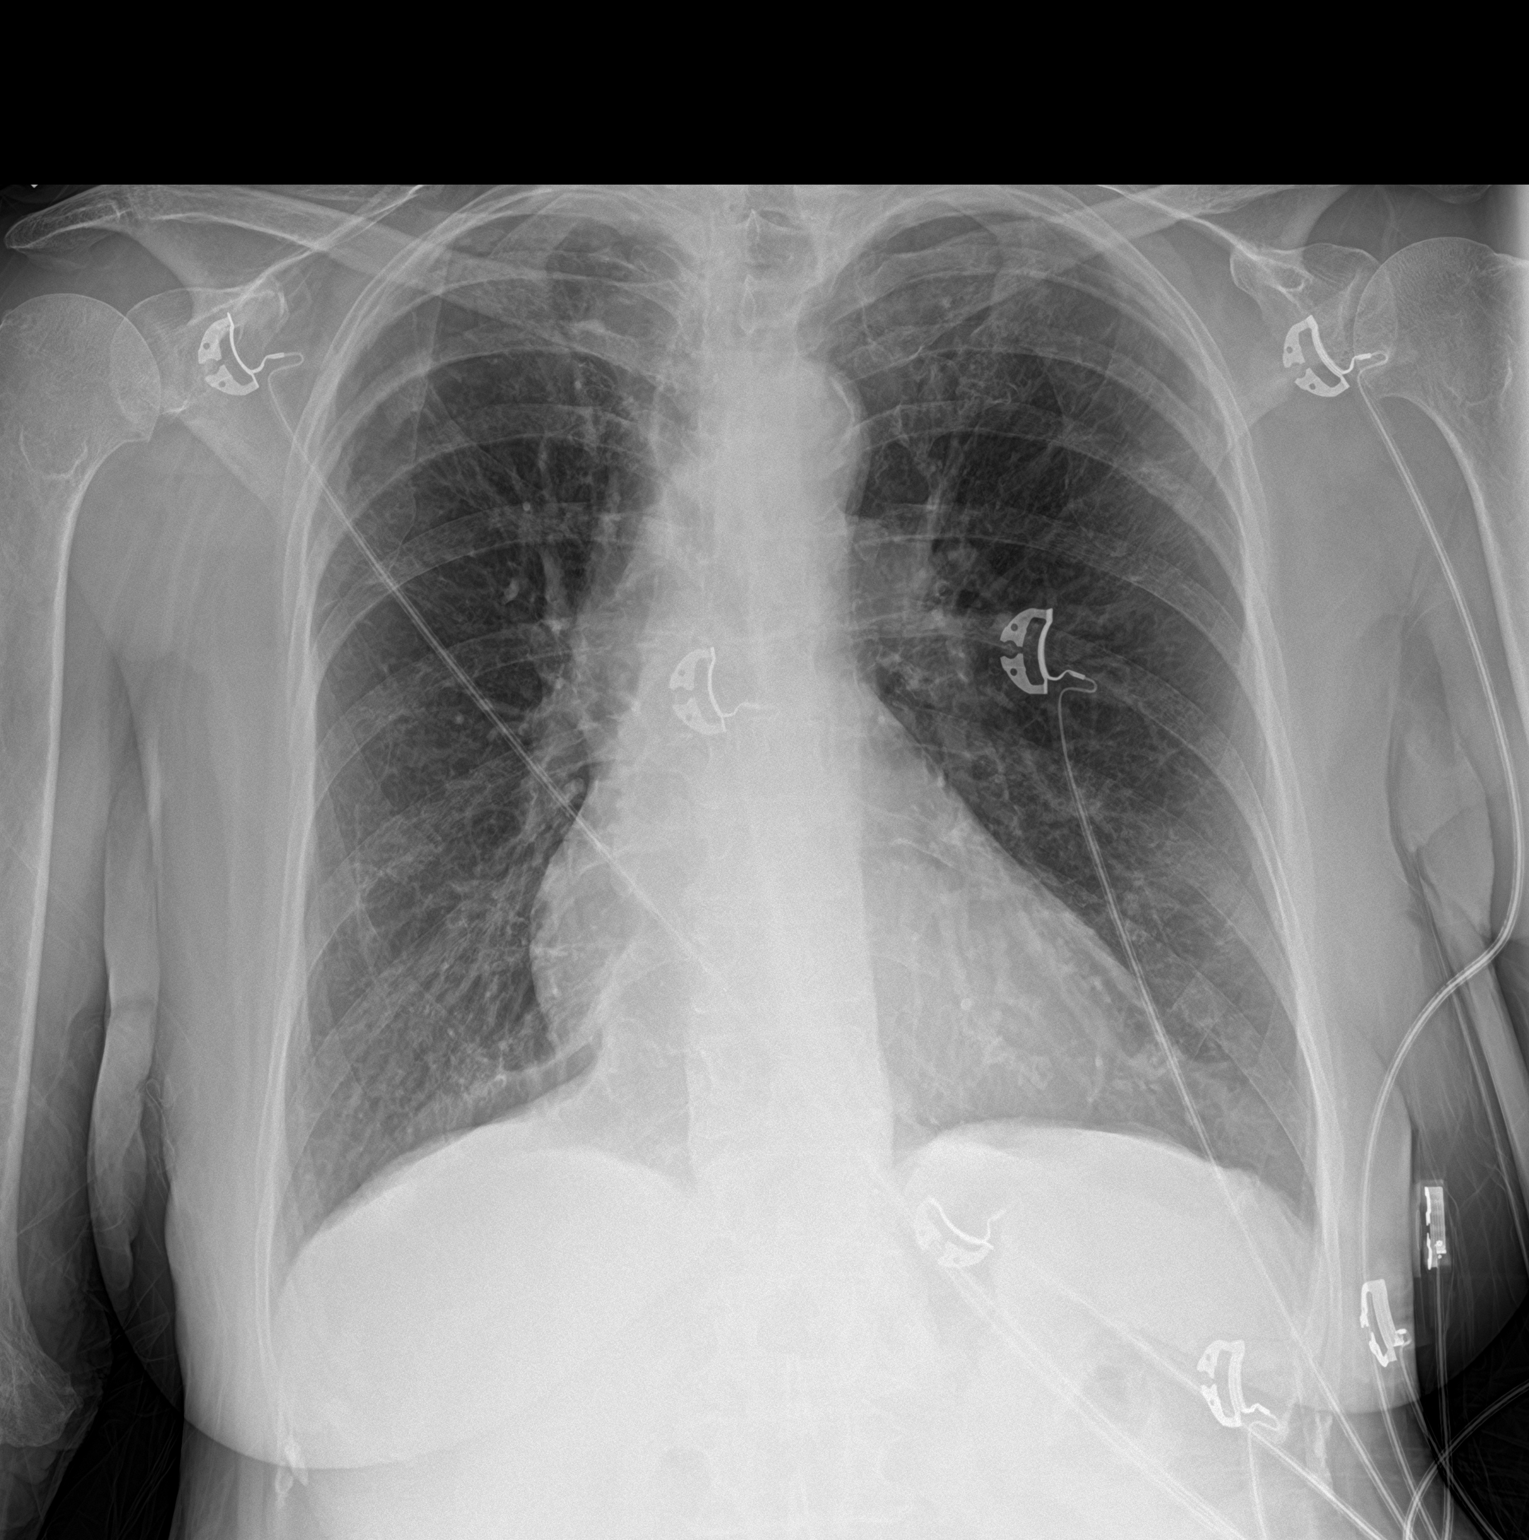

[2 of 2 positions shown; findings below may reference images not displayed]

FINDINGS: Cardiomediastinal silhouette is within normal limits for AP
technique. Aortic atherosclerosis is noted. The lungs are
hyperinflated with slight chronic appearing interstitial coarsening
and minimal scattered lung scarring. No confluent airspace opacity,
edema, pleural effusion, or pneumothorax is identified. No acute
osseous abnormality is identified.
IMPRESSION: Hyperinflation/COPD without evidence acute cardiopulmonary disease.

## 2019-03-26 IMAGING — CT CT HEAD W/O CM
3 series · 15 of 47 positions shown, 18 images · non-contrast
Comparison: Brain MRI 08/03/2008

CLINICAL DATA: Altered mental status.

EXAM:
CT HEAD WITHOUT CONTRAST
TECHNIQUE: Contiguous axial images were obtained from the base of the skull
through the vertex without intravenous contrast.

[Series 2: head wo · axial · 0.47mm/px · z∈[-130,-5]mm · 9 of 30 slices shown, 12 images]
[im 3/30  brain]
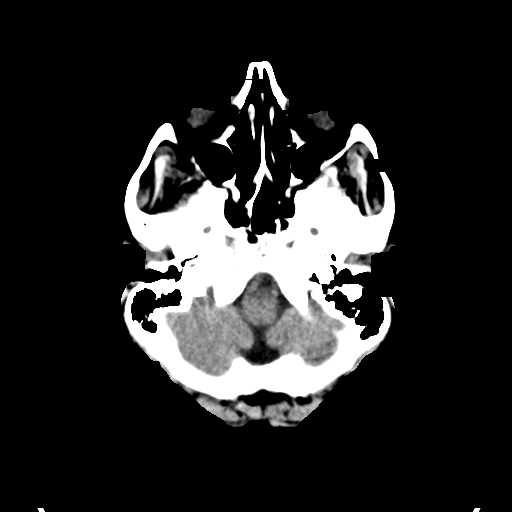
[im 3/30  bone]
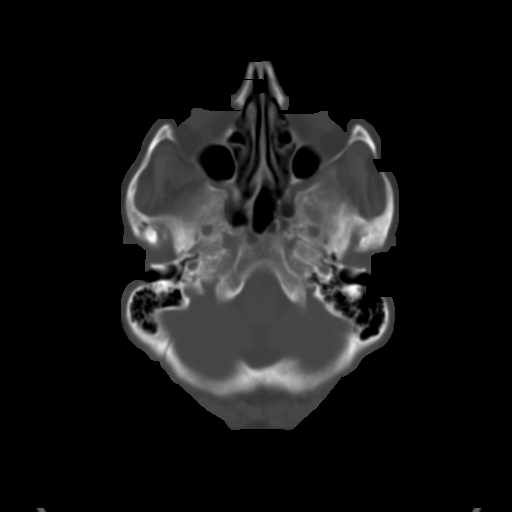
[im 6/30  brain]
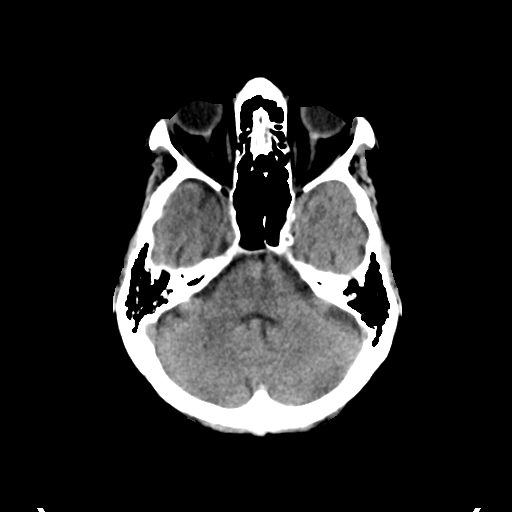
[im 9/30  brain]
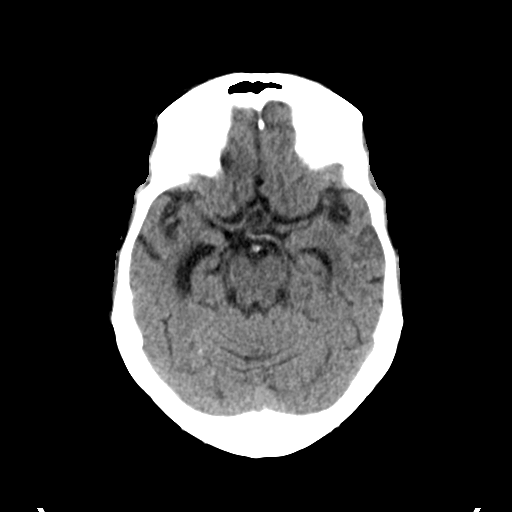
[im 12/30  brain]
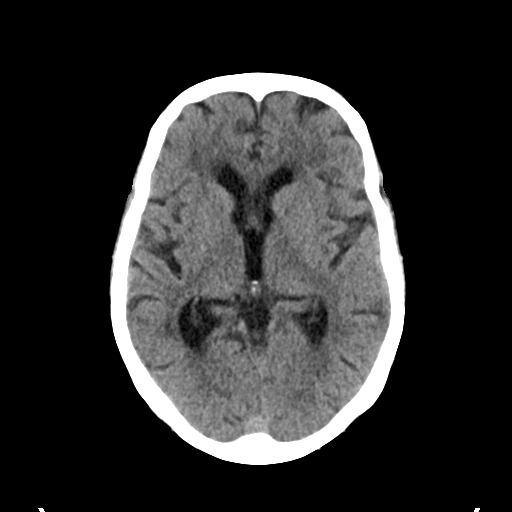
[im 16/30  brain]
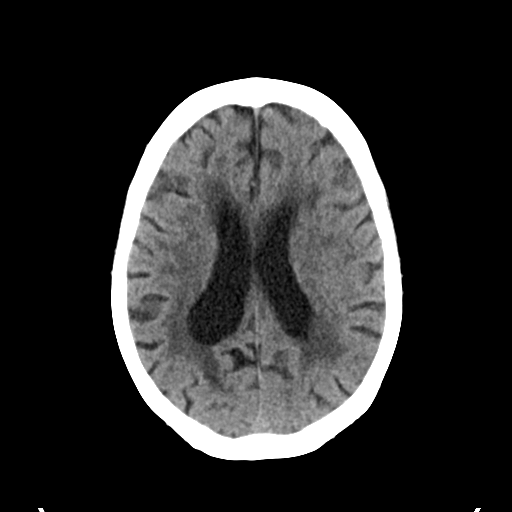
[im 16/30  bone]
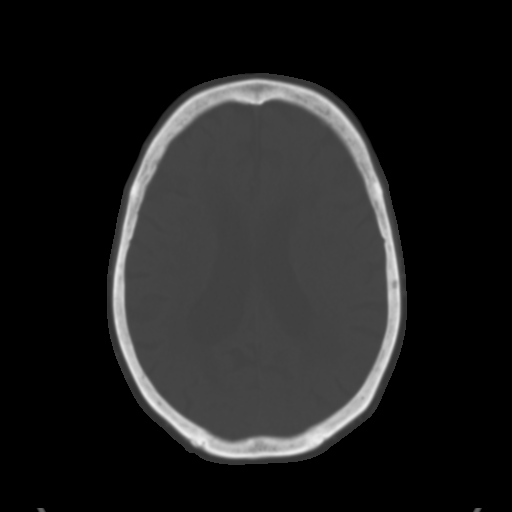
[im 19/30  brain]
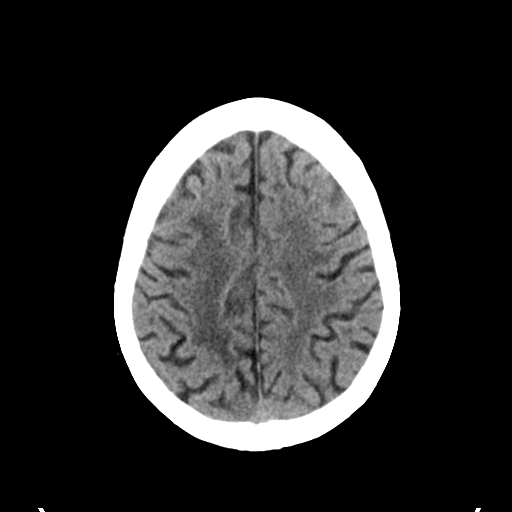
[im 22/30  brain]
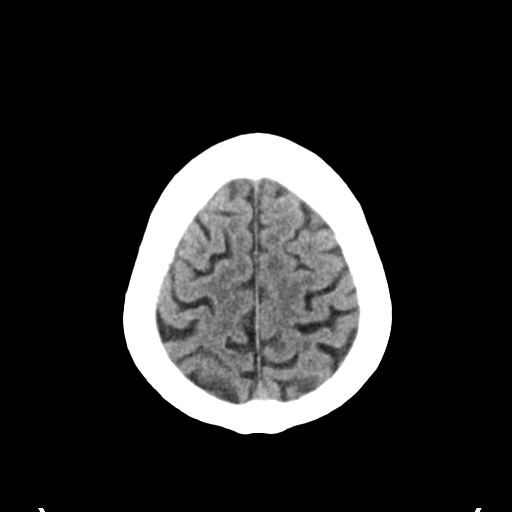
[im 25/30  brain]
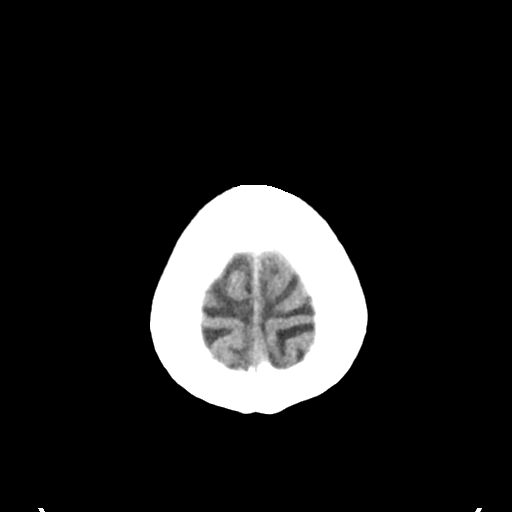
[im 28/30  brain]
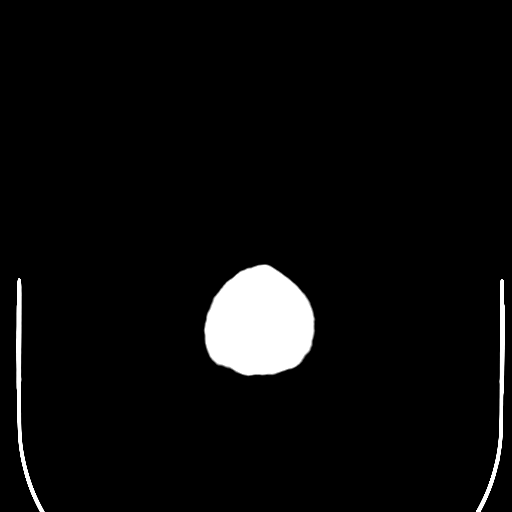
[im 28/30  bone]
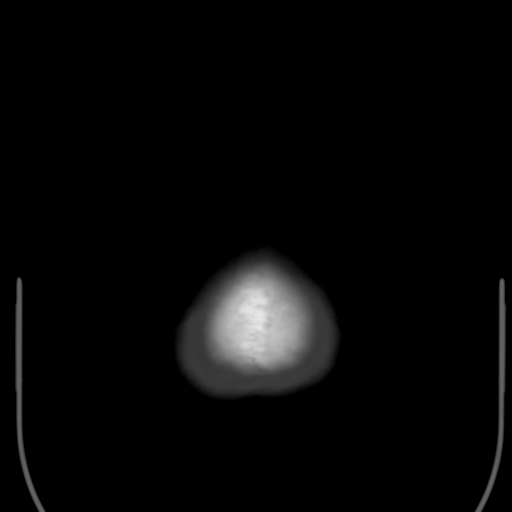

[Series 4: coronal soft tissue · coronal · 0.28mm/px · 3 of 66 slices shown]
[im 22/66  brain]
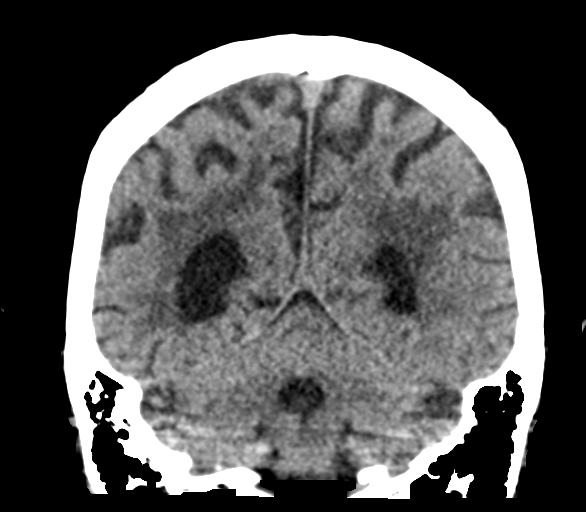
[im 29/66  brain]
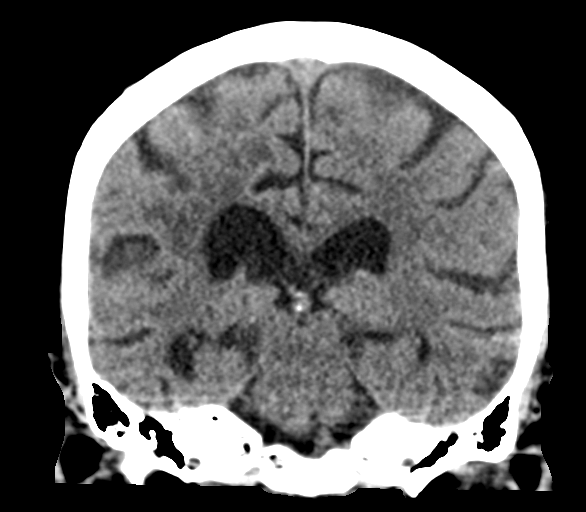
[im 37/66  brain]
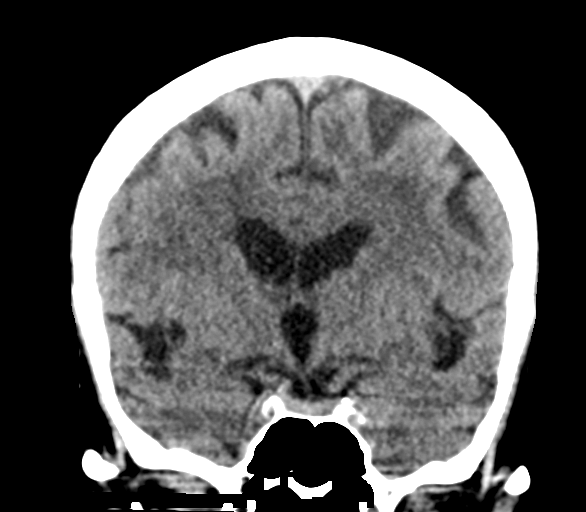

[Series 5: sagittal soft tissue · sagittal · 0.30mm/px · 3 of 56 slices shown]
[im 19/56  brain]
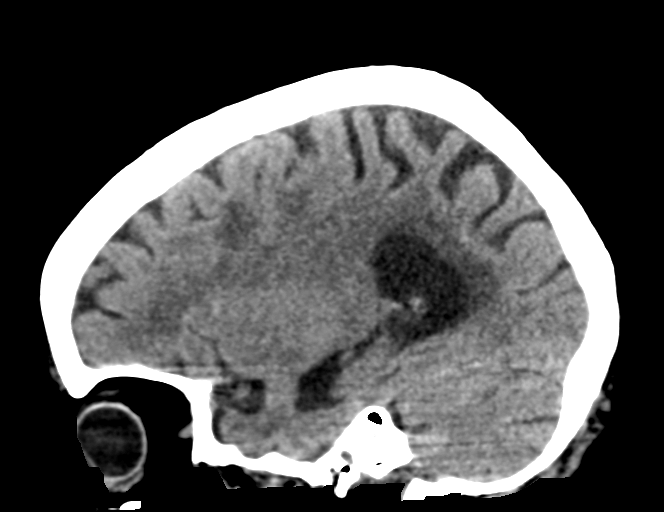
[im 28/56  brain]
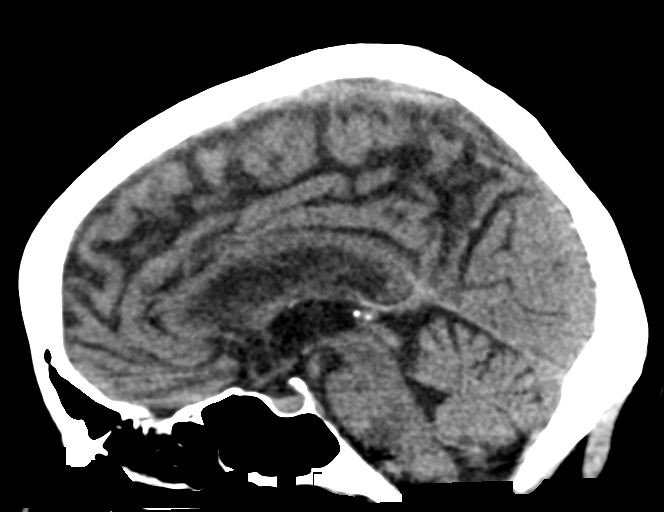
[im 37/56  brain]
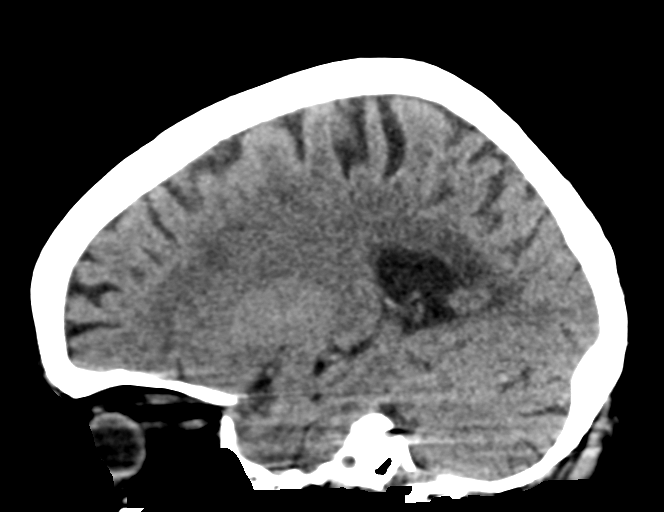

[15 of 47 positions shown; findings below may reference images not displayed]

FINDINGS: Brain: There is mild right greater than left lateral
ventriculomegaly which is increased from 3772. Ventricular
enlargement is out of proportion to the degree of sulcal
enlargement. Patchy subcortical and periventricular white matter
hypodensities are greater than the extent of FLAIR/ T2 abnormality
on the prior MRI and are moderately advanced for age. There is no
evidence of acute cortically based infarct, intracranial hemorrhage,
mass, midline shift, or extra-axial fluid collection.

Vascular: Calcified atherosclerosis at the skullbase.

Skull: No fracture focal osseous lesion.

Sinuses/Orbits: Visualized paranasal sinuses and mastoid air cells
are clear. Bilateral cataract extraction.

Other: None.
IMPRESSION: 1. No evidence of acute infarct or hemorrhage.
2. Progressive cerebral white matter disease likely reflecting
chronic small vessel ischemia.
3. Progressive mild lateral ventriculomegaly, favored to reflect
central predominant cerebral atrophy or less likely hydrocephalus.

## 2020-01-12 DEATH — deceased
